# Patient Record
Sex: Male | Born: 1955 | Race: Black or African American | Hispanic: No | Marital: Married | Smoking: Current every day smoker
Health system: Southern US, Community
[De-identification: ages and names within clinical notes are randomized; demographics above are authoritative.]

## PROBLEM LIST (undated history)

## (undated) DIAGNOSIS — J45909 Unspecified asthma, uncomplicated: Secondary | ICD-10-CM

## (undated) DIAGNOSIS — E785 Hyperlipidemia, unspecified: Secondary | ICD-10-CM

## (undated) DIAGNOSIS — I2510 Atherosclerotic heart disease of native coronary artery without angina pectoris: Secondary | ICD-10-CM

## (undated) DIAGNOSIS — I10 Essential (primary) hypertension: Secondary | ICD-10-CM

## (undated) DIAGNOSIS — I509 Heart failure, unspecified: Secondary | ICD-10-CM

## (undated) DIAGNOSIS — Z8701 Personal history of pneumonia (recurrent): Secondary | ICD-10-CM

## (undated) DIAGNOSIS — I219 Acute myocardial infarction, unspecified: Secondary | ICD-10-CM

## (undated) HISTORY — DX: Atherosclerotic heart disease of native coronary artery without angina pectoris: I25.10

## (undated) HISTORY — DX: Essential (primary) hypertension: I10

## (undated) HISTORY — DX: Unspecified asthma, uncomplicated: J45.909

## (undated) HISTORY — DX: Personal history of pneumonia (recurrent): Z87.01

## (undated) HISTORY — PX: APPENDECTOMY: SHX54

## (undated) HISTORY — DX: Hyperlipidemia, unspecified: E78.5

---

## 2005-07-04 ENCOUNTER — Emergency Department (HOSPITAL_COMMUNITY): Admission: EM | Admit: 2005-07-04 | Discharge: 2005-07-04 | Payer: Self-pay | Admitting: Emergency Medicine

## 2013-11-27 ENCOUNTER — Encounter: Payer: Self-pay | Admitting: Cardiology

## 2013-11-27 DIAGNOSIS — E782 Mixed hyperlipidemia: Secondary | ICD-10-CM | POA: Insufficient documentation

## 2013-11-27 DIAGNOSIS — I2510 Atherosclerotic heart disease of native coronary artery without angina pectoris: Secondary | ICD-10-CM | POA: Insufficient documentation

## 2013-11-27 DIAGNOSIS — I10 Essential (primary) hypertension: Secondary | ICD-10-CM | POA: Insufficient documentation

## 2013-11-27 NOTE — Progress Notes (Signed)
Patient canceled.  This encounter was created in error - please disregard. 

## 2013-12-01 ENCOUNTER — Ambulatory Visit (INDEPENDENT_AMBULATORY_CARE_PROVIDER_SITE_OTHER): Payer: Medicaid Other | Admitting: Cardiovascular Disease

## 2013-12-01 ENCOUNTER — Encounter: Payer: Self-pay | Admitting: *Deleted

## 2013-12-01 ENCOUNTER — Encounter: Payer: Self-pay | Admitting: Cardiovascular Disease

## 2013-12-01 VITALS — BP 127/78 | HR 82 | Ht 69.0 in | Wt 190.0 lb

## 2013-12-01 DIAGNOSIS — Z9861 Coronary angioplasty status: Secondary | ICD-10-CM

## 2013-12-01 DIAGNOSIS — I209 Angina pectoris, unspecified: Secondary | ICD-10-CM

## 2013-12-01 DIAGNOSIS — R079 Chest pain, unspecified: Secondary | ICD-10-CM

## 2013-12-01 DIAGNOSIS — I509 Heart failure, unspecified: Secondary | ICD-10-CM

## 2013-12-01 DIAGNOSIS — Z7189 Other specified counseling: Secondary | ICD-10-CM

## 2013-12-01 DIAGNOSIS — F17200 Nicotine dependence, unspecified, uncomplicated: Secondary | ICD-10-CM

## 2013-12-01 DIAGNOSIS — I25119 Atherosclerotic heart disease of native coronary artery with unspecified angina pectoris: Secondary | ICD-10-CM

## 2013-12-01 DIAGNOSIS — Z955 Presence of coronary angioplasty implant and graft: Secondary | ICD-10-CM

## 2013-12-01 DIAGNOSIS — Z716 Tobacco abuse counseling: Secondary | ICD-10-CM

## 2013-12-01 DIAGNOSIS — I2510 Atherosclerotic heart disease of native coronary artery without angina pectoris: Secondary | ICD-10-CM

## 2013-12-01 MED ORDER — VARENICLINE TARTRATE 0.5 MG X 11 & 1 MG X 42 PO MISC: ORAL | Status: DC

## 2013-12-01 MED ORDER — NITROGLYCERIN 0.4 MG SL SUBL: 0.4000 mg | SUBLINGUAL_TABLET | SUBLINGUAL | Status: DC | PRN

## 2013-12-01 NOTE — Patient Instructions (Addendum)
   Chantix starter pack  Nitroglycerin as needed for severe chest pain only   Above medications sent to pharmacy Continue all other medications.   Your physician has requested that you have an echocardiogram. Echocardiography is a painless test that uses sound waves to create images of your heart. It provides your doctor with information about the size and shape of your heart and how well your heart's chambers and valves are working. This procedure takes approximately one hour. There are no restrictions for this procedure. Your physician has requested that you have a lexiscan myoview. For further information please visit [email or web address]. Please follow instruction sheet, as given. Office will contact with results via phone or letter.   Follow up in  1 month

## 2013-12-01 NOTE — Progress Notes (Signed)
Patient ID: Sofie RowerClarence E Ferg, male   DOB: Mar 06, 1955, 58 y.o.   MRN: [id number]       CARDIOLOGY CONSULT NOTE  Patient ID: Sofie RowerClarence E Krell MRN: [id number] DOB/AGE: Mar 06, 1955 58 y.o.  Admit date: (Not on file) Primary Physician DONDIEGO,RICHARD M, MD  Reason for Consultation: CAD, CHF  HPI: The patient  is a [age] year old male who was recently hospitalized [facility] for a COPD exacerbation with possible underlying congestive heart failure. He reportedly has a history of coronary artery disease and prior stent placement, hypertension, hyperlipidemia and tobacco abuse. He ruled out for an acute coronary syndrome with negative troponins. An ECG dated 11/23/2013 showed normal sinus rhythm with LVH and repolarization abnormalities. I reviewed all relevant studies from his hospitalization which included a BUN of 10, creatinine 1.09, sodium 139, potassium 3.5, white blood cells 9.7, hemoglobin 14.4, platelets 309.  He reportedly had a stent placed in 2003 at [facility] in [place], [place]. He said he had a heart attack at that time. He has been having episodic chest pain at rest since his hospitalization. He said this is the first time this has happened in 3-4 years. He does not have any sublingual nitroglycerin. He has 2 pillow orthopnea and has had 2 episodes of paroxysmal nocturnal dyspnea. He is currently not on a diuretic. He said he can walk indefinitely on level ground but has difficulty walking uphill and get short of breath. His cut back his smoking from one pack a day to 5 or 6 cigarettes daily and wants to quit. He denies palpitations, leg swelling, dizziness and syncope.  Soc: Smokes 5-6 cigarettes daily, had been 1 ppd for several years.  No Known Allergies  Current Outpatient Prescriptions  Medication Sig Dispense Refill  . albuterol (PROVENTIL) (2.5 MG/3ML) 0.083% nebulizer solution Take 2.5 mg by nebulization every 6 (six) hours as needed for wheezing or  shortness of breath.      Marland Kitchen. amLODipine-olmesartan (AZOR) 5-40 MG per tablet Take 1 tablet by mouth daily.      Marland Kitchen. levofloxacin (LEVAQUIN) 750 MG tablet Take 750 mg by mouth daily.      . pravastatin (PRAVACHOL) 40 MG tablet Take 40 mg by mouth daily.      . predniSONE (DELTASONE) 20 MG tablet Take 20 mg by mouth daily with breakfast.       No current facility-administered medications for this visit.    Past Medical History  Diagnosis Date  . Coronary atherosclerosis of native coronary artery   . Essential hypertension, benign   . Hyperlipidemia   . Childhood asthma   . History of pneumonia     September 2015 - [facility]     Past Surgical History  Procedure Laterality Date  . Appendectomy      History   Social History  . Marital Status: Married    Spouse Name: N/A    Number of Children: N/A  . Years of Education: N/A   Occupational History  . Not on file.   Social History Main Topics  . Smoking status: Current Every Day Smoker -- 0.50 packs/day    Types: Cigarettes    Start date: 03/03/1979  . Smokeless tobacco: Never Used  . Alcohol Use: Yes     Comment: Occasional  . Drug Use: No     Comment: Prior history of cocaine and marijuana (positive UDS 2004)  . Sexual Activity: Not on file   Other Topics Concern  . Not on file  Social History Narrative  . No narrative on file     No family history of premature CAD in 1st degree relatives.  Prior to Admission medications   Medication Sig Start Date End Date Taking? Authorizing Provider  albuterol (PROVENTIL) (2.5 MG/3ML) 0.083% nebulizer solution Take 2.5 mg by nebulization every 6 (six) hours as needed for wheezing or shortness of breath.   Yes Historical Provider, MD  amLODipine-olmesartan (AZOR) 5-40 MG per tablet Take 1 tablet by mouth daily.   Yes Historical Provider, MD  levofloxacin (LEVAQUIN) 750 MG tablet Take 750 mg by mouth daily.   Yes Historical Provider, MD  pravastatin (PRAVACHOL) 40 MG  tablet Take 40 mg by mouth daily.   Yes Historical Provider, MD  predniSONE (DELTASONE) 20 MG tablet Take 20 mg by mouth daily with breakfast.   Yes Historical Provider, MD     Review of systems complete and found to be negative unless listed above in HPI     Physical exam Blood pressure 127/78, pulse 82, height 5\' 9"  (1.753 m), weight 190 lb (86.183 kg), SpO2 94.00%. General: NAD Neck: No JVD, no thyromegaly or thyroid nodule.  Lungs: Diminished air entry b/l, intermittent faint inspiratory wheezes, no crackles. CV: Nondisplaced PMI. Regular rate and rhythm, normal S1/S2, no S3/S4, no murmur.  No peripheral edema.  No carotid bruit.    Abdomen: Soft, nontender, no hepatosplenomegaly, no distention.  Skin: Intact without lesions or rashes.  Neurologic: Alert and oriented x 3.  Psych: Normal affect. Extremities: No clubbing or cyanosis.  HEENT: Normal.   ECG: Most recent ECG reviewed.  Labs:   No results found for this basename: WBC, HGB, HCT, MCV, PLT   No results found for this basename: NA, K, CL, CO2, BUN, CREATININE, CALCIUM, LABALBU, PROT, BILITOT, ALKPHOS, ALT, AST, GLUCOSE,  in the last 168 hours No results found for this basename: CKTOTAL, CKMB, CKMBINDEX, TROPONINI    No results found for this basename: CHOL   No results found for this basename: HDL   No results found for this basename: LDLCALC   No results found for this basename: TRIG   No results found for this basename: CHOLHDL   No results found for this basename: LDLDIRECT         Studies: No results found.  ASSESSMENT AND PLAN:  1. CAD: Will prescribe SL nitro. He is taking ASA and statin, but no beta blocker. Will attempt to get records from [place]. Will obtain Lexiscan Cardiolite stress test and echocardiogram for further clarification. 2. Essential HTN: Controlled on current therapy which includes Azor 5/40 mg daily. 3. Hyperlipidemia: On pravastatin 40 mg daily. 4. Possible CHF (uncertain  type): Will obtain an echocardiogram for further clarification. Presently not on a diuretic.  5. Tobacco abuse: Cessation counseling given. Will prescribe Chantix starter pack.  Dispo: f/u 1 month.  Signed: Prentice Docker, M.D., F.A.C.C.  12/01/2013, 9:02 AM

## 2013-12-08 ENCOUNTER — Ambulatory Visit (HOSPITAL_COMMUNITY)
Admission: RE | Admit: 2013-12-08 | Discharge: 2013-12-08 | Disposition: A | Discharge status: Home / Self Care | Payer: Medicaid Other | Source: Ambulatory Visit | Attending: Cardiovascular Disease | Admitting: Cardiovascular Disease

## 2013-12-08 ENCOUNTER — Encounter (HOSPITAL_COMMUNITY)
Admission: RE | Admit: 2013-12-08 | Discharge: 2013-12-08 | Disposition: A | Discharge status: Home / Self Care | Payer: Medicaid Other | Source: Ambulatory Visit | Attending: Cardiovascular Disease | Admitting: Cardiovascular Disease

## 2013-12-08 ENCOUNTER — Encounter (HOSPITAL_COMMUNITY): Payer: Self-pay

## 2013-12-08 DIAGNOSIS — I10 Essential (primary) hypertension: Secondary | ICD-10-CM | POA: Insufficient documentation

## 2013-12-08 DIAGNOSIS — I25119 Atherosclerotic heart disease of native coronary artery with unspecified angina pectoris: Secondary | ICD-10-CM

## 2013-12-08 DIAGNOSIS — I2510 Atherosclerotic heart disease of native coronary artery without angina pectoris: Secondary | ICD-10-CM | POA: Insufficient documentation

## 2013-12-08 DIAGNOSIS — R079 Chest pain, unspecified: Secondary | ICD-10-CM | POA: Insufficient documentation

## 2013-12-08 DIAGNOSIS — I509 Heart failure, unspecified: Secondary | ICD-10-CM

## 2013-12-08 DIAGNOSIS — J449 Chronic obstructive pulmonary disease, unspecified: Secondary | ICD-10-CM | POA: Diagnosis not present

## 2013-12-08 DIAGNOSIS — I517 Cardiomegaly: Secondary | ICD-10-CM

## 2013-12-08 DIAGNOSIS — R0602 Shortness of breath: Secondary | ICD-10-CM | POA: Diagnosis present

## 2013-12-08 HISTORY — DX: Heart failure, unspecified: I50.9

## 2013-12-08 MED ORDER — TECHNETIUM TC 99M SESTAMIBI GENERIC - CARDIOLITE
30.0000 | Freq: Once | INTRAVENOUS | Status: AC | PRN
  Administered 2013-12-08: 30 via INTRAVENOUS

## 2013-12-08 MED ORDER — SODIUM CHLORIDE 0.9 % IJ SOLN
10.0000 mL | INTRAMUSCULAR | Status: DC | PRN
  Administered 2013-12-08: 10 mL via INTRAVENOUS

## 2013-12-08 MED ORDER — REGADENOSON 0.4 MG/5ML IV SOLN
INTRAVENOUS | Status: AC
  Administered 2013-12-08: 0.4 mg via INTRAVENOUS
  Filled 2013-12-08: qty 5

## 2013-12-08 MED ORDER — TECHNETIUM TC 99M SESTAMIBI - CARDIOLITE
10.0000 | Freq: Once | INTRAVENOUS | Status: AC | PRN
  Administered 2013-12-08: 09:00:00 10 via INTRAVENOUS

## 2013-12-08 MED ORDER — REGADENOSON 0.4 MG/5ML IV SOLN
0.4000 mg | Freq: Once | INTRAVENOUS | Status: AC | PRN
  Administered 2013-12-08: 0.4 mg via INTRAVENOUS

## 2013-12-08 MED ORDER — SODIUM CHLORIDE 0.9 % IJ SOLN
INTRAMUSCULAR | Status: AC
  Administered 2013-12-08: 10 mL via INTRAVENOUS
  Filled 2013-12-08: qty 10

## 2013-12-08 NOTE — Progress Notes (Signed)
Stress Lab Nurses Notes - Jeffery Munoz  Jeffery Munoz 12/08/2013 Reason for doing test: CAD CHF & SOB Type of test: Marlane HatcherLexiscan Cardiolite Nurse performing test: Parke PoissonPhyllis Billingsly, RN Nuclear Medicine Tech: Lyndel Pleasureyan Liles Echo Tech: Not Applicable MD performing test: Koneswaran/K.Lyman BishopLawrence NP Family MD: [facility] Test explained and consent signed: Yes.   IV started: No redness or edema and Saline lock started in radiology Symptoms: headache & stomach discomfort Treatment/Intervention: None Reason test stopped: protocol completed After recovery IV was: Discontinued via X-ray tech and No redness or edema Patient to return to Nuc. Med at : 11:35 Patient discharged: Home Patient's Condition upon discharge was: stable Comments: During test BP 137/81 & HR 93.  Recovery BP 147/87 & HR 75 .  Symptoms resolved in recovery.  Erskine SpeedBillingsley, Santana Gosdin T

## 2013-12-08 NOTE — Progress Notes (Signed)
  Echocardiogram 2D Echocardiogram has been performed.  Jeffery Munoz 12/08/2013, 12:07 PM

## 2013-12-14 ENCOUNTER — Telehealth: Payer: Self-pay | Admitting: *Deleted

## 2013-12-14 ENCOUNTER — Encounter: Payer: Self-pay | Admitting: *Deleted

## 2013-12-14 NOTE — Telephone Encounter (Signed)
Notes Recorded by Lesle ChrisAngela G Evalisse Prajapati, LPN on 16/10/960410/02/2014 at 2:52 PM Fast busy signal again. Patient notified of results by letter.  ------  Notes Recorded by Lesle ChrisAngela G Graziella Connery, LPN on 54/0/981110/10/2013 at 3:40 PM Fast busy signal again. ------  Notes Recorded by Lesle ChrisAngela G Kayson Bullis, LPN on 91/4/782910/10/2013 at 9:52 AM Busy.

## 2013-12-14 NOTE — Telephone Encounter (Signed)
STRESS TEST -   Notes Recorded by Laqueta LindenSuresh A Koneswaran, MD on 12/09/2013 at 9:43 AM Inferior wall infarct seen, no new blockages to suggest ischemia. Continue medical therapy for CAD.

## 2013-12-14 NOTE — Telephone Encounter (Addendum)
ECHO -   Message copied by Lesle ChrisHILL, ANGELA G on Mon Dec 14, 2013  2:54 PM ------      Message from: Prentice DockerKONESWARAN, SURESH A      Created: Tue Dec 08, 2013  3:54 PM       Normal pumping function. ------

## 2013-12-31 ENCOUNTER — Ambulatory Visit: Payer: Medicaid Other | Admitting: Cardiovascular Disease

## 2014-01-11 ENCOUNTER — Ambulatory Visit: Payer: Medicaid Other | Admitting: Cardiovascular Disease

## 2014-01-13 ENCOUNTER — Encounter: Payer: Self-pay | Admitting: Cardiovascular Disease

## 2014-01-13 ENCOUNTER — Ambulatory Visit (INDEPENDENT_AMBULATORY_CARE_PROVIDER_SITE_OTHER): Payer: Medicaid Other | Admitting: Cardiovascular Disease

## 2014-01-13 VITALS — BP 150/90 | HR 67 | Ht 69.0 in | Wt 196.0 lb

## 2014-01-13 DIAGNOSIS — E785 Hyperlipidemia, unspecified: Secondary | ICD-10-CM

## 2014-01-13 DIAGNOSIS — Z716 Tobacco abuse counseling: Secondary | ICD-10-CM

## 2014-01-13 DIAGNOSIS — I10 Essential (primary) hypertension: Secondary | ICD-10-CM

## 2014-01-13 DIAGNOSIS — I5032 Chronic diastolic (congestive) heart failure: Secondary | ICD-10-CM | POA: Diagnosis not present

## 2014-01-13 DIAGNOSIS — I25119 Atherosclerotic heart disease of native coronary artery with unspecified angina pectoris: Secondary | ICD-10-CM

## 2014-01-13 DIAGNOSIS — Z955 Presence of coronary angioplasty implant and graft: Secondary | ICD-10-CM

## 2014-01-13 MED ORDER — VARENICLINE TARTRATE 0.5 MG X 11 & 1 MG X 42 PO MISC: ORAL | Status: DC

## 2014-01-13 MED ORDER — AMLODIPINE-OLMESARTAN 10-40 MG PO TABS: 1.0000 | ORAL_TABLET | Freq: Every day | ORAL | Status: DC

## 2014-01-13 NOTE — Progress Notes (Signed)
Patient ID: Jeffery Munoz, male   DOB: 1956/02/28, 58 y.o.   MRN: [id number]      SUBJECTIVE: The patient presents for follow-up after undergoing cardiovascular testing performed for the evaluation of chest pain in the setting of coronary artery disease. Nuclear stress test on 12/09/2013 demonstrated inferior wall scar with inferior wall hypokinesis, with no ischemic territories noted. Echocardiogram demonstrated normal left ventricular systolic function, EF 65-99%, moderate LVH with grade 2 diastolic dysfunction.  He denies any further episodes of chest pain and has not had to use sublingual nitroglycerin. He did not receive the Chantix starter kit which I prescribed.  Review of Systems: As per "subjective", otherwise negative.  No Known Allergies  Current Outpatient Prescriptions  Medication Sig Dispense Refill  . amLODipine-olmesartan (AZOR) 5-40 MG per tablet Take 1 tablet by mouth daily.    Marland Kitchen levofloxacin (LEVAQUIN) 750 MG tablet Take 750 mg by mouth daily.    . nitroGLYCERIN (NITROSTAT) 0.4 MG SL tablet Place 1 tablet (0.4 mg total) under the tongue every 5 (five) minutes as needed for chest pain. 90 tablet 3  . pravastatin (PRAVACHOL) 40 MG tablet Take 40 mg by mouth daily.    . predniSONE (DELTASONE) 20 MG tablet Take 20 mg by mouth daily with breakfast.    . varenicline (CHANTIX STARTING MONTH PAK) 0.5 MG X 11 & 1 MG X 42 tablet Take one 0.5 mg tablet by mouth once daily for 3 days, then increase to one 0.5 mg tablet twice daily for 4 days, then increase to one 1 mg tablet twice daily. 53 tablet 0  . albuterol (PROVENTIL) (2.5 MG/3ML) 0.083% nebulizer solution Take 2.5 mg by nebulization every 6 (six) hours as needed for wheezing or shortness of breath.     No current facility-administered medications for this visit.    Past Medical History  Diagnosis Date  . Coronary atherosclerosis of native coronary artery   . Essential hypertension, benign   . Hyperlipidemia   .  Childhood asthma   . History of pneumonia     September 2015 - treated at [facility]   . CHF (congestive heart failure)     Past Surgical History  Procedure Laterality Date  . Appendectomy      History   Social History  . Marital Status: Married    Spouse Name: N/A    Number of Children: N/A  . Years of Education: N/A   Occupational History  . Not on file.   Social History Main Topics  . Smoking status: Current Every Day Smoker -- 0.50 packs/day    Types: Cigarettes    Start date: 03/03/1979  . Smokeless tobacco: Never Used  . Alcohol Use: Yes     Comment: Occasional  . Drug Use: No     Comment: Prior history of cocaine and marijuana (positive UDS 2004)  . Sexual Activity: Not on file   Other Topics Concern  . Not on file   Social History Narrative     Filed Vitals:   01/13/14 1514  BP: 150/90  Pulse: 67  Height: 5' 9"  (1.753 m)  Weight: 196 lb (88.905 kg)    PHYSICAL EXAM General: NAD Neck: No JVD, no thyromegaly or thyroid nodule.  Lungs: Diminished air entry b/l, intermittent faint inspiratory wheezes, no crackles. CV: Nondisplaced PMI. Regular rate and rhythm, normal S1/S2, no S3/S4, no murmur. No peripheral edema. No carotid bruit.  Abdomen: Soft, nontender, no hepatosplenomegaly, no distention.  Skin: Intact without lesions or rashes.  Neurologic: Alert and oriented x 3.  Psych: Normal affect. Skin: Normal. Musculoskeletal: Normal range of motion, no gross deformities. Extremities: No clubbing or cyanosis.   ECG: Most recent ECG reviewed.      ASSESSMENT AND PLAN: 1. CAD: Stable ischemic heart disease. Continue SL nitro prn, along with ASA and statin. 2. Essential HTN: Elevated on current therapy which includes Azor 5/40 mg daily. Given grade 2 diastolic dysfunction, will increase amlodipine component to 10 mg. 3. Hyperlipidemia: On pravastatin 40 mg daily. No changes. 4. Chronic diastolic heart failure: Euvolemic and stable with  echo results noted above. Controlling BP will be of prime importance.  5. Tobacco abuse: I previously prescribed a Chantix starter pack and gave him cessation counseling. Will give another prescription.  Dispo: f/u 6 months.   Kate Sable, M.D., F.A.C.C.

## 2014-01-13 NOTE — Patient Instructions (Addendum)
Your physician recommends that you schedule a follow-up appointment in: 6 months. You will receive a reminder letter in the mail in about 4 months reminding you to call and schedule your appointment. If you don't receive this letter, please contact our office. Your physician has recommended you make the following change in your medication:  Increase your azor to 10/40 mg daily.  Continue all other medications the same.

## 2014-01-25 ENCOUNTER — Other Ambulatory Visit: Payer: Self-pay | Admitting: Cardiovascular Disease

## 2014-01-25 NOTE — Telephone Encounter (Signed)
Prior auth request received for azor.

## 2014-01-25 NOTE — Telephone Encounter (Signed)
Please see refill bin for Prior Authorization / tgs  °

## 2014-03-02 ENCOUNTER — Telehealth: Payer: Self-pay | Admitting: *Deleted

## 2014-03-02 ENCOUNTER — Other Ambulatory Visit: Payer: Self-pay | Admitting: *Deleted

## 2014-03-02 MED ORDER — LOSARTAN POTASSIUM 50 MG PO TABS: 50.0000 mg | ORAL_TABLET | Freq: Every day | ORAL | Status: DC

## 2014-03-02 MED ORDER — AMLODIPINE BESYLATE 10 MG PO TABS: 10.0000 mg | ORAL_TABLET | Freq: Every day | ORAL | Status: DC

## 2014-03-02 NOTE — Telephone Encounter (Signed)
Can switch to amlodipine 10 mg and losartan 50 mg daily.

## 2014-03-02 NOTE — Telephone Encounter (Signed)
Prior Authorization for Azor denied by Medicaid. Patient is required to try and fail two preferred agents from the same class unless there is clinical justification for the use of Azor.

## 2014-03-03 ENCOUNTER — Other Ambulatory Visit: Payer: Self-pay | Admitting: Cardiovascular Disease

## 2014-03-03 NOTE — Telephone Encounter (Signed)
Prior Authorization# [id number] for losartan 50 mg.

## 2014-03-03 NOTE — Telephone Encounter (Signed)
Prior authorization for Losartan / please see refill bin / tgs

## 2014-04-22 ENCOUNTER — Emergency Department (HOSPITAL_COMMUNITY)
Admission: EM | Admit: 2014-04-22 | Discharge: 2014-04-23 | Disposition: A | Discharge status: Home / Self Care | Payer: Medicaid Other | Attending: Emergency Medicine | Admitting: Emergency Medicine

## 2014-04-22 ENCOUNTER — Encounter (HOSPITAL_COMMUNITY): Payer: Self-pay | Admitting: *Deleted

## 2014-04-22 DIAGNOSIS — E785 Hyperlipidemia, unspecified: Secondary | ICD-10-CM | POA: Insufficient documentation

## 2014-04-22 DIAGNOSIS — I509 Heart failure, unspecified: Secondary | ICD-10-CM | POA: Diagnosis not present

## 2014-04-22 DIAGNOSIS — Z720 Tobacco use: Secondary | ICD-10-CM | POA: Insufficient documentation

## 2014-04-22 DIAGNOSIS — J45909 Unspecified asthma, uncomplicated: Secondary | ICD-10-CM | POA: Insufficient documentation

## 2014-04-22 DIAGNOSIS — R443 Hallucinations, unspecified: Secondary | ICD-10-CM

## 2014-04-22 DIAGNOSIS — I10 Essential (primary) hypertension: Secondary | ICD-10-CM | POA: Diagnosis not present

## 2014-04-22 DIAGNOSIS — Z8701 Personal history of pneumonia (recurrent): Secondary | ICD-10-CM | POA: Insufficient documentation

## 2014-04-22 DIAGNOSIS — Z79899 Other long term (current) drug therapy: Secondary | ICD-10-CM | POA: Diagnosis not present

## 2014-04-22 DIAGNOSIS — N390 Urinary tract infection, site not specified: Secondary | ICD-10-CM | POA: Diagnosis not present

## 2014-04-22 DIAGNOSIS — Z7952 Long term (current) use of systemic steroids: Secondary | ICD-10-CM | POA: Diagnosis not present

## 2014-04-22 DIAGNOSIS — I2510 Atherosclerotic heart disease of native coronary artery without angina pectoris: Secondary | ICD-10-CM | POA: Diagnosis not present

## 2014-04-22 NOTE — ED Notes (Signed)
Pt is a prisoner , says his bp med was changed  And it has been up and down over a week, No pain or sx at present.

## 2014-04-23 ENCOUNTER — Emergency Department (HOSPITAL_COMMUNITY): Payer: Medicaid Other

## 2014-04-23 LAB — RAPID URINE DRUG SCREEN, HOSP PERFORMED
Amphetamines: NOT DETECTED
BENZODIAZEPINES: NOT DETECTED
Barbiturates: NOT DETECTED
COCAINE: NOT DETECTED
OPIATES: NOT DETECTED
TETRAHYDROCANNABINOL: NOT DETECTED

## 2014-04-23 LAB — COMPREHENSIVE METABOLIC PANEL
ALBUMIN: 4.8 g/dL (ref 3.5–5.2)
ALK PHOS: 68 U/L (ref 39–117)
ALT: 11 U/L (ref 0–53)
ANION GAP: 5 (ref 5–15)
AST: 20 U/L (ref 0–37)
BUN: 14 mg/dL (ref 6–23)
CHLORIDE: 107 mmol/L (ref 96–112)
CO2: 27 mmol/L (ref 19–32)
Calcium: 9.7 mg/dL (ref 8.4–10.5)
Creatinine, Ser: 1.37 mg/dL — ABNORMAL HIGH (ref 0.50–1.35)
GFR calc Af Amer: 64 mL/min — ABNORMAL LOW (ref >90)
GFR calc non Af Amer: 55 mL/min — ABNORMAL LOW (ref >90)
GLUCOSE: 103 mg/dL — AB (ref 70–99)
POTASSIUM: 3.9 mmol/L (ref 3.5–5.1)
Sodium: 139 mmol/L (ref 135–145)
Total Bilirubin: 0.9 mg/dL (ref 0.3–1.2)
Total Protein: 7.8 g/dL (ref 6.0–8.3)

## 2014-04-23 LAB — URINALYSIS, ROUTINE W REFLEX MICROSCOPIC
Glucose, UA: NEGATIVE mg/dL
Ketones, ur: NEGATIVE mg/dL
Nitrite: NEGATIVE
Specific Gravity, Urine: >1.03 — ABNORMAL HIGH (ref 1.005–1.030)
UROBILINOGEN UA: 0.2 mg/dL (ref 0.0–1.0)
pH: 5.5 (ref 5.0–8.0)

## 2014-04-23 LAB — DIFFERENTIAL
BASOS PCT: 1 % (ref 0–1)
Basophils Absolute: 0 10*3/uL (ref 0.0–0.1)
EOS PCT: 2 % (ref 0–5)
Eosinophils Absolute: 0.1 10*3/uL (ref 0.0–0.7)
Lymphocytes Relative: 39 % (ref 12–46)
Lymphs Abs: 2.5 10*3/uL (ref 0.7–4.0)
MONO ABS: 1 10*3/uL (ref 0.1–1.0)
MONOS PCT: 15 % — AB (ref 3–12)
Neutro Abs: 2.8 10*3/uL (ref 1.7–7.7)
Neutrophils Relative %: 43 % (ref 43–77)

## 2014-04-23 LAB — CBC
HEMATOCRIT: 44.6 % (ref 39.0–52.0)
HEMOGLOBIN: 14.8 g/dL (ref 13.0–17.0)
MCH: 30.8 pg (ref 26.0–34.0)
MCHC: 33.2 g/dL (ref 30.0–36.0)
MCV: 92.9 fL (ref 78.0–100.0)
Platelets: 231 10*3/uL (ref 150–400)
RBC: 4.8 MIL/uL (ref 4.22–5.81)
RDW: 12.7 % (ref 11.5–15.5)
WBC: 6.5 10*3/uL (ref 4.0–10.5)

## 2014-04-23 LAB — LIPASE, BLOOD: Lipase: 30 U/L (ref 11–59)

## 2014-04-23 LAB — URINE MICROSCOPIC-ADD ON

## 2014-04-23 LAB — PROTIME-INR
INR: 1.25 (ref 0.00–1.49)
Prothrombin Time: 15.8 seconds — ABNORMAL HIGH (ref 11.6–15.2)

## 2014-04-23 LAB — APTT: APTT: 34 s (ref 24–37)

## 2014-04-23 LAB — TROPONIN I
Troponin I: 0.15 ng/mL — ABNORMAL HIGH (ref <0.031)
Troponin I: 0.14 ng/mL — ABNORMAL HIGH (ref <0.031)

## 2014-04-23 MED ORDER — DEXTROSE 5 % IV SOLN
2.0000 g | Freq: Once | INTRAVENOUS | Status: AC
  Administered 2014-04-23: 2 g via INTRAVENOUS
  Filled 2014-04-23: qty 2

## 2014-04-23 MED ORDER — CLONIDINE HCL 0.2 MG PO TABS
0.2000 mg | ORAL_TABLET | Freq: Once | ORAL | Status: AC
Start: 2014-04-23 — End: 2014-04-23
  Administered 2014-04-23: 0.2 mg via ORAL
  Filled 2014-04-23: qty 1

## 2014-04-23 MED ORDER — LEVOFLOXACIN 750 MG PO TABS: 750.0000 mg | ORAL_TABLET | Freq: Every day | ORAL | Status: DC

## 2014-04-23 MED ORDER — SODIUM CHLORIDE 0.9 % IV BOLUS (SEPSIS)
1000.0000 mL | Freq: Once | INTRAVENOUS | Status: AC
  Administered 2014-04-23: 1000 mL via INTRAVENOUS

## 2014-04-23 NOTE — Discharge Instructions (Signed)
Drink plenty of fluids. Take the antibiotic until gone. Your jail nurse can check on the urine culture results in 2-3 days.  Return to the ED if you feel worse, such as fever, chills, vomiting, weakness.  Your clonidine cannot be stopped "cold [place]". It needs to be tapered off slowly or you will get rebound hypertension. I would suggest putting you back on the 0.2 mg twice a day for 1-2 days then drop it down to 0.1 mg twice a day for a week, then 0.1 mg once a day for a week, then stop.

## 2014-04-23 NOTE — ED Notes (Signed)
Patient has EKG done and placed on cardiac monitoring. Patient has Technical sales engineerfficer with him

## 2014-04-23 NOTE — ED Notes (Signed)
When returning from [place], pt B/P registered 179/103, rechecked B/P-160/96 P-56, informed Dr. Lynelle DoctorKnapp.

## 2014-04-23 NOTE — ED Provider Notes (Signed)
CSN: [id number]     Arrival date & time 04/22/14  2128 History   First MD Initiated Contact with Patient 04/22/14 2304     Chief Complaint  Patient presents with  . Hypertension     (Consider location/radiation/quality/duration/timing/severity/associated sxs/prior Treatment) HPI  Patient reports he's had hypertension for the past 40 years. He states his blood pressure medications have recently been changed. Looking at his documentation from his jail he had been on Azor 10/40 and he had also been on clonidine 0.2 mg twice a day that was stopped on February 16. On February 16 they added Norvasc 10 mg and losartan 50 mg daily. Per the notes from his jail they report he had some unusual behavior today. He was hiding under his bunk. When patient was asked about that he states he was playing a trichomoniasis on his next cell mate. They also described he had the Vicodin sheets wrapped around his arms. When patient was asked about that he said he did that because of the dogs that were in the jail today. He states there were 5 dogs in the jail with their handlers. He states one was a [place] and tube were pimples and there were a couple other dogs. He states to the handlers were in prison uniform and the others were in civilian clothes. He states they were letting the dogs jump and bark at people. He states they told him he was going to have to fight the dogs. The officer with patient states he would be very unusual for them to bring in more than one dog at a time. He states they do bring in a dog periodically to check or drugs. He states he is going to call the jail to see if they did have dogs in the jail today. Patient also states he has started having some right-sided abdominal pain that he gets intermittently. He has not had nausea, vomiting, or diarrhea. He states he had pneumonia about 2 months ago and still has a mild lingering cough. Other than the unusual behavior described at the jail patient is  fully alert and oriented to year date place. His officer reports patient has no difficulty walking.  PCP Dr Janna Arch  Past Medical History  Diagnosis Date  . Coronary atherosclerosis of native coronary artery   . Essential hypertension, benign   . Hyperlipidemia   . Childhood asthma   . History of pneumonia     September 2015 - treated at [facility]   . CHF (congestive heart failure)    Past Surgical History  Procedure Laterality Date  . Appendectomy     Family History  Problem Relation Age of Onset  . Heart disease Mother     Age 24-70   History  Substance Use Topics  . Smoking status: Current Every Day Smoker -- 0.50 packs/day    Types: Cigarettes    Start date: 03/03/1979  . Smokeless tobacco: Never Used  . Alcohol Use: Yes     Comment: Occasional    Review of Systems  All other systems reviewed and are negative.     Allergies  Review of patient's allergies indicates no known allergies.  Home Medications   Prior to Admission medications   Medication Sig Start Date End Date Taking? Authorizing Provider  albuterol (PROVENTIL) (2.5 MG/3ML) 0.083% nebulizer solution Take 2.5 mg by nebulization every 6 (six) hours as needed for wheezing or shortness of breath.    Historical Provider, MD  amLODipine (NORVASC) 10 MG tablet  Take 1 tablet (10 mg total) by mouth daily. 03/02/14   Laqueta Linden, MD  amLODipine-olmesartan (AZOR) 10-40 MG per tablet Take 1 tablet by mouth daily. 01/13/14   Laqueta Linden, MD  levofloxacin (LEVAQUIN) 750 MG tablet Take 750 mg by mouth daily.  September 2015    Historical Provider, MD  losartan (COZAAR) 50 MG tablet Take 1 tablet (50 mg total) by mouth daily. 03/02/14   Laqueta Linden, MD  nitroGLYCERIN (NITROSTAT) 0.4 MG SL tablet Place 1 tablet (0.4 mg total) under the tongue every 5 (five) minutes as needed for chest pain. 12/01/13   Laqueta Linden, MD  pravastatin (PRAVACHOL) 40 MG tablet Take 40 mg by mouth daily.     Historical Provider, MD  predniSONE (DELTASONE) 20 MG tablet Take 20 mg by mouth daily with breakfast.    Historical Provider, MD  varenicline (CHANTIX STARTING MONTH PAK) 0.5 MG X 11 & 1 MG X 42 tablet Take one 0.5 mg tablet by mouth once daily for 3 days, then increase to one 0.5 mg tablet twice daily for 4 days, then increase to one 1 mg tablet twice daily. 01/13/14   Laqueta Linden, MD    ED Triage Vitals  Enc Vitals Group     BP 04/22/14 2136 181/96 mmHg     Pulse Rate 04/22/14 2136 62     Resp 04/22/14 2136 20     Temp 04/22/14 2136 98 F (36.7 C)     Temp Source 04/22/14 2136 Oral     SpO2 04/22/14 2136 100 %     Weight 04/22/14 2136 189 lb (85.73 kg)     Height 04/22/14 2136  (1.778 m)     Head Cir --      Peak Flow --      Pain Score 04/22/14 2321 6     Pain Loc --      Pain Edu? --      Excl. in GC? --    Vital signs normal except hypertension    Physical Exam  Constitutional: He is oriented to person, place, and time. He appears well-developed and well-nourished.  Non-toxic appearance. He does not appear ill. No distress.  HENT:  Head: Normocephalic and atraumatic.  Right Ear: External ear normal.  Left Ear: External ear normal.  Nose: Nose normal. No mucosal edema or rhinorrhea.  Mouth/Throat: Oropharynx is clear and moist and mucous membranes are normal. No dental abscesses or uvula swelling.  Eyes: Conjunctivae and EOM are normal. Pupils are equal, round, and reactive to light.  Neck: Normal range of motion and full passive range of motion without pain. Neck supple.  Cardiovascular: Normal rate, regular rhythm and normal heart sounds.  Exam reveals no gallop and no friction rub.   No murmur heard. Pulmonary/Chest: Effort normal and breath sounds normal. No respiratory distress. He has no wheezes. He has no rhonchi. He has no rales. He exhibits no tenderness and no crepitus.  Abdominal: Soft. Normal appearance and bowel sounds are normal. He exhibits no  distension. There is no tenderness. There is no rebound and no guarding.    Area of patient's abdominal pain is noted.  Musculoskeletal: Normal range of motion. He exhibits no edema or tenderness.  Moves all extremities well.   Neurological: He is alert and oriented to person, place, and time. He has normal strength. No cranial nerve deficit.  Cranial nerves III through XII intact, there is no facial asymmetry, there is no pronator  drift, he has some mild weakness on his left grip. His lower extremities are not weak.  Skin: Skin is warm, dry and intact. No rash noted. No erythema. No pallor.  Psychiatric: He has a normal mood and affect. His speech is normal and behavior is normal. His mood appears not anxious.  Nursing note and vitals reviewed.   ED Course  Procedures (including critical care time)  Medications  cloNIDine (CATAPRES) tablet 0.2 mg (not administered)  cefTRIAXone (ROCEPHIN) 2 g in dextrose 5 % 50 mL IVPB (0 g Intravenous Stopped 04/23/14 0343)  sodium chloride 0.9 % bolus 1,000 mL (1,000 mLs Intravenous New Bag/Given 04/23/14 0350)    Patient's blood pressure improved while in the ED to 150/96 at 12:30 AM without treatment.  Review of patient's prior records shows he was evaluated by Dr.Koneswaren, cardiology, in September. He verbally describes the patient's EKG is showing LVH with repolarization changes. At that time he describes patient had been admitted at [facility] for COPD exacerbation and possible congestive heart failure. At time patient was on Azor for his hypertension. Patient had a stress test done in October which showed inferior wall infarct but no new ischemic changes. He had echocardiogram showing ejection fraction of 60-65% with moderate left ventricular hypertrophy and a grade 2 diastolic dysfunction. At that point Norvasc was added to the Azor for his blood pressure.  02:00 officer has verified there were no dogs in the jail today. Pt given his test  results and need to get repeat troponin done at 3 am. Pt hasn't given an urinalysis sample yet, he was given oral fluids to drink.  After reviewing patient's urinalysis result he was given Rocephin 2 g IV. This would explain some of his unusual behavior.  04:00 Discussed his troponin results. Pt verifies he hasn't been on the levaquin since September (was documented in Dr Sharene Skeans note in September after pt admitted for COPD exacerbation).   Labs Review Results for orders placed or performed during the hospital encounter of 04/22/14  Protime-INR  Result Value Ref Range   Prothrombin Time 15.8 (H) 11.6 - 15.2 seconds   INR 1.25 0.00 - 1.49  APTT  Result Value Ref Range   aPTT 34 24 - 37 seconds  CBC  Result Value Ref Range   WBC 6.5 4.0 - 10.5 K/uL   RBC 4.80 4.22 - 5.81 MIL/uL   Hemoglobin 14.8 13.0 - 17.0 g/dL   HCT 16.1 09.6 - 04.5 %   MCV 92.9 78.0 - 100.0 fL   MCH 30.8 26.0 - 34.0 pg   MCHC 33.2 30.0 - 36.0 g/dL   RDW 40.9 81.1 - 91.4 %   Platelets 231 150 - 400 K/uL  Differential  Result Value Ref Range   Neutrophils Relative % 43 43 - 77 %   Neutro Abs 2.8 1.7 - 7.7 K/uL   Lymphocytes Relative 39 12 - 46 %   Lymphs Abs 2.5 0.7 - 4.0 K/uL   Monocytes Relative 15 (H) 3 - 12 %   Monocytes Absolute 1.0 0.1 - 1.0 K/uL   Eosinophils Relative 2 0 - 5 %   Eosinophils Absolute 0.1 0.0 - 0.7 K/uL   Basophils Relative 1 0 - 1 %   Basophils Absolute 0.0 0.0 - 0.1 K/uL  Comprehensive metabolic panel  Result Value Ref Range   Sodium 139 135 - 145 mmol/L   Potassium 3.9 3.5 - 5.1 mmol/L   Chloride 107 96 - 112 mmol/L   CO2 27  19 - 32 mmol/L   Glucose, Bld 103 (H) 70 - 99 mg/dL   BUN 14 6 - 23 mg/dL   Creatinine, Ser 1.611.37 (H) 0.50 - 1.35 mg/dL   Calcium 9.7 8.4 - 09.610.5 mg/dL   Total Protein 7.8 6.0 - 8.3 g/dL   Albumin 4.8 3.5 - 5.2 g/dL   AST 20 0 - 37 U/L   ALT 11 0 - 53 U/L   Alkaline Phosphatase 68 39 - 117 U/L   Total Bilirubin 0.9 0.3 - 1.2 mg/dL   GFR calc non Af  Amer 55 (L) >90 mL/min   GFR calc Af Amer 64 (L) >90 mL/min   Anion gap 5 5 - 15  Urine Drug Screen  Result Value Ref Range   Opiates NONE DETECTED NONE DETECTED   Cocaine NONE DETECTED NONE DETECTED   Benzodiazepines NONE DETECTED NONE DETECTED   Amphetamines NONE DETECTED NONE DETECTED   Tetrahydrocannabinol NONE DETECTED NONE DETECTED   Barbiturates NONE DETECTED NONE DETECTED  Urinalysis, Routine w reflex microscopic  Result Value Ref Range   Color, Urine AMBER (A) YELLOW   APPearance HAZY (A) CLEAR   Specific Gravity, Urine >1.030 (H) 1.005 - 1.030   pH 5.5 5.0 - 8.0   Glucose, UA NEGATIVE NEGATIVE mg/dL   Hgb urine dipstick SMALL (A) NEGATIVE   Bilirubin Urine SMALL (A) NEGATIVE   Ketones, ur NEGATIVE NEGATIVE mg/dL   Protein, ur TRACE (A) NEGATIVE mg/dL   Urobilinogen, UA 0.2 0.0 - 1.0 mg/dL   Nitrite NEGATIVE NEGATIVE   Leukocytes, UA SMALL (A) NEGATIVE  Troponin I  Result Value Ref Range   Troponin I 0.15 (H) <0.031 ng/mL  Lipase, blood  Result Value Ref Range   Lipase 30 11 - 59 U/L  Troponin I  Result Value Ref Range   Troponin I 0.14 (H) <0.031 ng/mL  Urine microscopic-add on  Result Value Ref Range   Squamous Epithelial / LPF MANY (A) RARE   WBC, UA TOO NUMEROUS TO COUNT <3 WBC/hpf   RBC / HPF 0-2 <3 RBC/hpf   Bacteria, UA MANY (A) RARE   Laboratory interpretation all normal except positive troponin, renal insufficiency     Imaging Review Ct Head Wo Contrast  04/23/2014   CLINICAL DATA:  Headache, hypertension, confusion  EXAM: CT HEAD WITHOUT CONTRAST  TECHNIQUE: Contiguous axial images were obtained from the base of the skull through the vertex without intravenous contrast.  COMPARISON:  None.  FINDINGS: There is no intracranial hemorrhage, mass or evidence of acute infarction. There is moderate generalized atrophy. Basal cisterns are patent.  There is moderate membrane thickening in the maxillary sinuses and ethmoid air cells. Mild sphenoid sinus  disease also noted. Mastoids are clear.  IMPRESSION: Moderate generalized atrophy. No acute findings are evident. Mild paranasal sinus disease.   Electronically Signed   By: Ellery Plunkaniel R Mitchell M.D.   On: 04/23/2014 01:24     EKG Interpretation   Date/Time:  Friday April 23 2014 00:15:37 EST Ventricular Rate:  56 PR Interval:  168 QRS Duration: 75 QT Interval:  452 QTC Calculation: 436 R Axis:   46 Text Interpretation:  Sinus rhythm Left atrial enlargement Probable LVH  with secondary repol abnrm Tall R wave in V2, consider RVH or PMI Anterior  ST elevation, probably due to LVH Baseline wander in lead(s) III No old  tracing to compare Confirmed by Darvis Croft  MD-I, Rhian Funari ([id number]) on 04/23/2014  12:47:36 AM      MDM  Final diagnoses:  Essential hypertension  Urinary tract infection without hematuria, site unspecified  Hallucination    New Prescriptions   LEVOFLOXACIN (LEVAQUIN) 750 MG TABLET    Take 1 tablet (750 mg total) by mouth daily.    Plan discharge  Devoria Albe, MD, Franz Dell, MD 04/23/14 [id number]

## 2014-04-24 LAB — URINE CULTURE
Colony Count: NO GROWTH
Culture: NO GROWTH

## 2017-07-28 ENCOUNTER — Encounter (HOSPITAL_COMMUNITY): Payer: Self-pay | Admitting: Emergency Medicine

## 2017-07-28 ENCOUNTER — Emergency Department (HOSPITAL_COMMUNITY): Payer: Medicaid Other

## 2017-07-28 ENCOUNTER — Other Ambulatory Visit: Payer: Self-pay

## 2017-07-28 ENCOUNTER — Emergency Department (HOSPITAL_COMMUNITY)
Admission: EM | Admit: 2017-07-28 | Discharge: 2017-07-28 | Disposition: A | Discharge status: Home / Self Care | Payer: Medicaid Other | Attending: Emergency Medicine | Admitting: Emergency Medicine

## 2017-07-28 DIAGNOSIS — I509 Heart failure, unspecified: Secondary | ICD-10-CM | POA: Diagnosis not present

## 2017-07-28 DIAGNOSIS — J45909 Unspecified asthma, uncomplicated: Secondary | ICD-10-CM | POA: Insufficient documentation

## 2017-07-28 DIAGNOSIS — R0789 Other chest pain: Secondary | ICD-10-CM | POA: Diagnosis present

## 2017-07-28 DIAGNOSIS — F17210 Nicotine dependence, cigarettes, uncomplicated: Secondary | ICD-10-CM | POA: Insufficient documentation

## 2017-07-28 DIAGNOSIS — I110 Hypertensive heart disease with heart failure: Secondary | ICD-10-CM | POA: Diagnosis not present

## 2017-07-28 DIAGNOSIS — I2510 Atherosclerotic heart disease of native coronary artery without angina pectoris: Secondary | ICD-10-CM | POA: Diagnosis not present

## 2017-07-28 HISTORY — DX: Acute myocardial infarction, unspecified: I21.9

## 2017-07-28 LAB — DIFFERENTIAL
Basophils Absolute: 0 10*3/uL (ref 0.0–0.1)
Basophils Relative: 0 %
Eosinophils Absolute: 0.2 10*3/uL (ref 0.0–0.7)
Eosinophils Relative: 3 %
LYMPHS PCT: 41 %
Lymphs Abs: 2.4 10*3/uL (ref 0.7–4.0)
MONOS PCT: 11 %
Monocytes Absolute: 0.6 10*3/uL (ref 0.1–1.0)
Neutro Abs: 2.6 10*3/uL (ref 1.7–7.7)
Neutrophils Relative %: 45 %

## 2017-07-28 LAB — BASIC METABOLIC PANEL
Anion gap: 8 (ref 5–15)
BUN: 13 mg/dL (ref 6–20)
CHLORIDE: 110 mmol/L (ref 101–111)
CO2: 24 mmol/L (ref 22–32)
Calcium: 9.1 mg/dL (ref 8.9–10.3)
Creatinine, Ser: 1.2 mg/dL (ref 0.61–1.24)
GFR calc non Af Amer: >60 mL/min (ref >60)
GLUCOSE: 93 mg/dL (ref 65–99)
Potassium: 3.6 mmol/L (ref 3.5–5.1)
Sodium: 142 mmol/L (ref 135–145)

## 2017-07-28 LAB — TROPONIN I
Troponin I: <0.03 ng/mL (ref <0.03)
Troponin I: <0.03 ng/mL (ref <0.03)

## 2017-07-28 LAB — HEPATIC FUNCTION PANEL
ALK PHOS: 72 U/L (ref 38–126)
ALT: 7 U/L — AB (ref 17–63)
AST: 13 U/L — AB (ref 15–41)
Albumin: 3.8 g/dL (ref 3.5–5.0)
Bilirubin, Direct: 0.1 mg/dL (ref 0.1–0.5)
Indirect Bilirubin: 0.6 mg/dL (ref 0.3–0.9)
TOTAL PROTEIN: 6.9 g/dL (ref 6.5–8.1)
Total Bilirubin: 0.7 mg/dL (ref 0.3–1.2)

## 2017-07-28 LAB — I-STAT TROPONIN, ED: Troponin i, poc: 0.02 ng/mL (ref 0.00–0.08)

## 2017-07-28 LAB — CBC
HCT: 43.2 % (ref 39.0–52.0)
Hemoglobin: 14.1 g/dL (ref 13.0–17.0)
MCH: 30.5 pg (ref 26.0–34.0)
MCHC: 32.6 g/dL (ref 30.0–36.0)
MCV: 93.5 fL (ref 78.0–100.0)
Platelets: 218 10*3/uL (ref 150–400)
RBC: 4.62 MIL/uL (ref 4.22–5.81)
RDW: 13.8 % (ref 11.5–15.5)
WBC: 5.7 10*3/uL (ref 4.0–10.5)

## 2017-07-28 NOTE — ED Triage Notes (Signed)
Chest pain across top of chest since last night.  Pt also reports cough for a few days.

## 2017-07-28 NOTE — ED Provider Notes (Signed)
[facility] EMERGENCY DEPARTMENT Provider Note   CSN: [id number] Arrival date & time: 07/28/17  1447     History   Chief Complaint Chief Complaint  Patient presents with  . Chest Pain    HPI Jeffery Munoz is a 62 y.o. male.  Pt with chest pain  The history is provided by the patient. No language interpreter was used.  Chest Pain   This is a new problem. The current episode started 2 days ago. The problem occurs constantly. The problem has not changed since onset.The pain is associated with movement. The pain is present in the substernal region. The pain is at a severity of 5/10. Pertinent negatives include no abdominal pain, no back pain, no cough and no headaches.  Pertinent negatives for past medical history include no seizures.    Past Medical History:  Diagnosis Date  . CHF (congestive heart failure) (HCC)   . Childhood asthma   . Coronary atherosclerosis of native coronary artery   . Essential hypertension, benign   . History of pneumonia    September 2015 - treated at [facility]   . Hyperlipidemia   . MI (myocardial infarction) (HCC)    8 years ago    Patient Active Problem List   Diagnosis Date Noted  . History of coronary artery stent placement 12/01/2013  . CHF (congestive heart failure) (HCC) 12/01/2013  . Coronary atherosclerosis of native coronary artery 11/27/2013  . Essential hypertension, benign 11/27/2013  . Mixed hyperlipidemia 11/27/2013    Past Surgical History:  Procedure Laterality Date  . APPENDECTOMY          Home Medications    Prior to Admission medications   Not on File    Family History Family History  Problem Relation Age of Onset  . Heart disease Mother        Age 55-70    Social History Social History   Tobacco Use  . Smoking status: Current Every Day Smoker    Packs/day: 0.50    Types: Cigarettes    Start date: 03/03/1979  . Smokeless tobacco: Never Used  Substance Use Topics  . Alcohol use: Yes   Comment: Occasional  . Drug use: No    Comment: Prior history of cocaine and marijuana (positive UDS 2004)     Allergies   Patient has no known allergies.   Review of Systems Review of Systems  Constitutional: Negative for appetite change and fatigue.  HENT: Negative for congestion, ear discharge and sinus pressure.   Eyes: Negative for discharge.  Respiratory: Negative for cough.   Cardiovascular: Positive for chest pain.  Gastrointestinal: Negative for abdominal pain and diarrhea.  Genitourinary: Negative for frequency and hematuria.  Musculoskeletal: Negative for back pain.  Skin: Negative for rash.  Neurological: Negative for seizures and headaches.  Psychiatric/Behavioral: Negative for hallucinations.     Physical Exam Updated Vital Signs BP (!) 175/106   Pulse 61   Temp 98.6 F (37 C) (Oral)   Resp (!) 22   Ht  (1.676 m)   Wt 81.6 kg (180 lb)   SpO2 98%   BMI 29.05 kg/m   Physical Exam  Constitutional: He is oriented to person, place, and time. He appears well-developed.  HENT:  Head: Normocephalic.  Eyes: Conjunctivae and EOM are normal. No scleral icterus.  Neck: Neck supple. No thyromegaly present.  Cardiovascular: Normal rate and regular rhythm. Exam reveals no gallop and no friction rub.  No murmur heard. Pulmonary/Chest: No stridor. He has  no wheezes. He has no rales. He exhibits no tenderness.  Abdominal: He exhibits no distension. There is no tenderness. There is no rebound.  Musculoskeletal: Normal range of motion. He exhibits no edema.  Lymphadenopathy:    He has no cervical adenopathy.  Neurological: He is oriented to person, place, and time. He exhibits normal muscle tone. Coordination normal.  Skin: No rash noted. No erythema.  Psychiatric: He has a normal mood and affect. His behavior is normal.     ED Treatments / Results  Labs (all labs ordered are listed, but only abnormal results are displayed) Labs Reviewed  HEPATIC FUNCTION  PANEL - Abnormal; Notable for the following components:      Result Value   AST 13 (*)    ALT 7 (*)    All other components within normal limits  BASIC METABOLIC PANEL  CBC  TROPONIN I  DIFFERENTIAL  TROPONIN I  I-STAT TROPONIN, ED    EKG EKG Interpretation  Date/Time:  Sunday Jul 28 2017 14:57:10 EDT Ventricular Rate:  65 PR Interval:    QRS Duration: 79 QT Interval:  412 QTC Calculation: 429 R Axis:   63 Text Interpretation:  Sinus rhythm Probable left atrial enlargement LVH with secondary repolarization abnormality Anterior ST elevation, probably due to LVH Baseline wander in lead(s) V1 Confirmed by Bethann Berkshire [phone]) on 07/28/2017 3:07:31 PM   Radiology Dg Chest 2 View  Result Date: 07/28/2017 CLINICAL DATA:  Chest pain since last night. EXAM: CHEST - 2 VIEW COMPARISON:  05/12/2016. FINDINGS: The cardiac silhouette remains at the upper limit of normal in size. The aorta remains mildly tortuous. Mildly prominent pulmonary vasculature with some improvement. Clear lungs. Mild central peribronchial thickening. Minimal thoracic spine degenerative changes. IMPRESSION: 1. No acute abnormality. 2. Mild chronic bronchitic changes. 3. Mild pulmonary vascular congestion with improvement. Electronically Signed   By: Beckie Salts M.D.   On: 07/28/2017 16:36    Procedures Procedures (including critical care time)  Medications Ordered in ED Medications - No data to display   Initial Impression / Assessment and Plan / ED Course  I have reviewed the triage vital signs and the nursing notes.  Pertinent labs & imaging results that were available during my care of the patient were reviewed by me and considered in my medical decision making (see chart for details).   Chemistries CBC unremarkable.  Troponin x2 was negative.  Doubt pain is cardiac related patient will follow-up with his doctor this week for recheck    Final Clinical Impressions(s) / ED Diagnoses   Final diagnoses:    Atypical chest pain    ED Discharge Orders    None       Bethann Berkshire, MD 08/01/17 [phone]

## 2017-07-28 NOTE — ED Provider Notes (Signed)
[facility] EMERGENCY DEPARTMENT Provider Note   CSN: [id number] Arrival date & time: 07/28/17  1447     History   Chief Complaint Chief Complaint  Patient presents with  . Chest Pain    HPI Jeffery Munoz is a 62 y.o. male.  Patient states that he has been having mild chest discomfort off and on for a few days.  He has also had a cough that is nonproductive.  Patient states the pain is not related to exertion he is not short of breath and having no sweating  The history is provided by the patient.  Chest Pain   This is a new problem. The current episode started 2 days ago. The problem occurs rarely. The problem has been resolved. Associated with: Unknown. The pain is present in the lateral region. The pain is at a severity of 2/10. Pertinent negatives include no abdominal pain, no back pain, no cough and no headaches.  Pertinent negatives for past medical history include no seizures.    Past Medical History:  Diagnosis Date  . CHF (congestive heart failure) (HCC)   . Childhood asthma   . Coronary atherosclerosis of native coronary artery   . Essential hypertension, benign   . History of pneumonia    September 2015 - treated at [facility]   . Hyperlipidemia   . MI (myocardial infarction) (HCC)    8 years ago    Patient Active Problem List   Diagnosis Date Noted  . History of coronary artery stent placement 12/01/2013  . CHF (congestive heart failure) (HCC) 12/01/2013  . Coronary atherosclerosis of native coronary artery 11/27/2013  . Essential hypertension, benign 11/27/2013  . Mixed hyperlipidemia 11/27/2013    Past Surgical History:  Procedure Laterality Date  . APPENDECTOMY          Home Medications    Prior to Admission medications   Not on File    Family History Family History  Problem Relation Age of Onset  . Heart disease Mother        Age 72-70    Social History Social History   Tobacco Use  . Smoking status: Current Every Day Smoker      Packs/day: 0.50    Types: Cigarettes    Start date: 03/03/1979  . Smokeless tobacco: Never Used  Substance Use Topics  . Alcohol use: Yes    Comment: Occasional  . Drug use: No    Comment: Prior history of cocaine and marijuana (positive UDS 2004)     Allergies   Patient has no known allergies.   Review of Systems Review of Systems  Constitutional: Negative for appetite change and fatigue.  HENT: Negative for congestion, ear discharge and sinus pressure.   Eyes: Negative for discharge.  Respiratory: Negative for cough.   Cardiovascular: Positive for chest pain.  Gastrointestinal: Negative for abdominal pain and diarrhea.  Genitourinary: Negative for frequency and hematuria.  Musculoskeletal: Negative for back pain.  Skin: Negative for rash.  Neurological: Negative for seizures and headaches.  Psychiatric/Behavioral: Negative for hallucinations.     Physical Exam Updated Vital Signs BP (!) 175/106   Pulse 61   Temp 98.6 F (37 C) (Oral)   Resp (!) 22   Ht  (1.676 m)   Wt 81.6 kg (180 lb)   SpO2 98%   BMI 29.05 kg/m   Physical Exam  Constitutional: He is oriented to person, place, and time. He appears well-developed.  HENT:  Head: Normocephalic.  Eyes: Conjunctivae and  EOM are normal. No scleral icterus.  Neck: Neck supple. No thyromegaly present.  Cardiovascular: Normal rate and regular rhythm. Exam reveals no gallop and no friction rub.  No murmur heard. Pulmonary/Chest: No stridor. He has no wheezes. He has no rales. He exhibits no tenderness.  Abdominal: He exhibits no distension. There is no tenderness. There is no rebound.  Musculoskeletal: Normal range of motion. He exhibits no edema.  Lymphadenopathy:    He has no cervical adenopathy.  Neurological: He is oriented to person, place, and time. He exhibits normal muscle tone. Coordination normal.  Skin: No rash noted. No erythema.  Psychiatric: He has a normal mood and affect. His behavior is  normal.     ED Treatments / Results  Labs (all labs ordered are listed, but only abnormal results are displayed) Labs Reviewed  HEPATIC FUNCTION PANEL - Abnormal; Notable for the following components:      Result Value   AST 13 (*)    ALT 7 (*)    All other components within normal limits  BASIC METABOLIC PANEL  CBC  TROPONIN I  DIFFERENTIAL  TROPONIN I  I-STAT TROPONIN, ED    EKG EKG Interpretation  Date/Time:  Sunday Jul 28 2017 14:57:10 EDT Ventricular Rate:  65 PR Interval:    QRS Duration: 79 QT Interval:  412 QTC Calculation: 429 R Axis:   63 Text Interpretation:  Sinus rhythm Probable left atrial enlargement LVH with secondary repolarization abnormality Anterior ST elevation, probably due to LVH Baseline wander in lead(s) V1 Confirmed by Bethann Berkshire [id number]) on 07/28/2017 3:07:31 PM   Radiology Dg Chest 2 View  Result Date: 07/28/2017 CLINICAL DATA:  Chest pain since last night. EXAM: CHEST - 2 VIEW COMPARISON:  05/12/2016. FINDINGS: The cardiac silhouette remains at the upper limit of normal in size. The aorta remains mildly tortuous. Mildly prominent pulmonary vasculature with some improvement. Clear lungs. Mild central peribronchial thickening. Minimal thoracic spine degenerative changes. IMPRESSION: 1. No acute abnormality. 2. Mild chronic bronchitic changes. 3. Mild pulmonary vascular congestion with improvement. Electronically Signed   By: Beckie Salts M.D.   On: 07/28/2017 16:36    Procedures Procedures (including critical care time)  Medications Ordered in ED Medications - No data to display   Initial Impression / Assessment and Plan / ED Course  I have reviewed the triage vital signs and the nursing notes.  Pertinent labs & imaging results that were available during my care of the patient were reviewed by me and considered in my medical decision making (see chart for details). CBC and chemistries unremarkable.  Troponin x2 is negative.  Chest x-ray  shows bronchitis EKG shows no acute changes patient no longer having chest discomfort in the emergency department.  Doubt this is coronary artery related.  Patient will be sent home and he is to see his family doctor in 4 days anyway      Final Clinical Impressions(s) / ED Diagnoses   Final diagnoses:  Atypical chest pain    ED Discharge Orders    None       Bethann Berkshire, MD 07/28/17 [place]ina [place]

## 2017-07-28 NOTE — Discharge Instructions (Addendum)
Follow-up this week as planned with your doctor.  Return sooner if problems

## 2019-11-23 ENCOUNTER — Emergency Department (HOSPITAL_COMMUNITY): Payer: Medicaid Other

## 2019-11-23 ENCOUNTER — Inpatient Hospital Stay (HOSPITAL_COMMUNITY)
Admission: EM | Admit: 2019-11-23 | Discharge: 2019-11-26 | DRG: 177 | Disposition: A | Discharge status: Home / Self Care | Payer: Medicaid Other | Attending: Internal Medicine | Admitting: Internal Medicine

## 2019-11-23 ENCOUNTER — Ambulatory Visit
Admission: EM | Admit: 2019-11-23 | Discharge: 2019-11-23 | Disposition: A | Discharge status: Home / Self Care | Payer: Medicaid Other

## 2019-11-23 ENCOUNTER — Other Ambulatory Visit: Payer: Self-pay

## 2019-11-23 ENCOUNTER — Encounter (HOSPITAL_COMMUNITY): Payer: Self-pay | Admitting: *Deleted

## 2019-11-23 DIAGNOSIS — F17210 Nicotine dependence, cigarettes, uncomplicated: Secondary | ICD-10-CM | POA: Diagnosis present

## 2019-11-23 DIAGNOSIS — I252 Old myocardial infarction: Secondary | ICD-10-CM

## 2019-11-23 DIAGNOSIS — E785 Hyperlipidemia, unspecified: Secondary | ICD-10-CM | POA: Diagnosis present

## 2019-11-23 DIAGNOSIS — Z955 Presence of coronary angioplasty implant and graft: Secondary | ICD-10-CM | POA: Diagnosis not present

## 2019-11-23 DIAGNOSIS — Z720 Tobacco use: Secondary | ICD-10-CM

## 2019-11-23 DIAGNOSIS — N179 Acute kidney failure, unspecified: Secondary | ICD-10-CM | POA: Diagnosis present

## 2019-11-23 DIAGNOSIS — U071 COVID-19: Secondary | ICD-10-CM | POA: Diagnosis present

## 2019-11-23 DIAGNOSIS — J9601 Acute respiratory failure with hypoxia: Secondary | ICD-10-CM | POA: Diagnosis present

## 2019-11-23 DIAGNOSIS — Z9049 Acquired absence of other specified parts of digestive tract: Secondary | ICD-10-CM | POA: Diagnosis not present

## 2019-11-23 DIAGNOSIS — I2510 Atherosclerotic heart disease of native coronary artery without angina pectoris: Secondary | ICD-10-CM | POA: Diagnosis present

## 2019-11-23 DIAGNOSIS — Z8249 Family history of ischemic heart disease and other diseases of the circulatory system: Secondary | ICD-10-CM

## 2019-11-23 DIAGNOSIS — J9600 Acute respiratory failure, unspecified whether with hypoxia or hypercapnia: Secondary | ICD-10-CM | POA: Diagnosis not present

## 2019-11-23 DIAGNOSIS — J1282 Pneumonia due to coronavirus disease 2019: Secondary | ICD-10-CM | POA: Diagnosis present

## 2019-11-23 DIAGNOSIS — I10 Essential (primary) hypertension: Secondary | ICD-10-CM | POA: Diagnosis present

## 2019-11-23 DIAGNOSIS — E860 Dehydration: Secondary | ICD-10-CM | POA: Diagnosis present

## 2019-11-23 LAB — COMPREHENSIVE METABOLIC PANEL
ALT: 11 U/L (ref 0–44)
AST: 31 U/L (ref 15–41)
Albumin: 3.4 g/dL — ABNORMAL LOW (ref 3.5–5.0)
Alkaline Phosphatase: 39 U/L (ref 38–126)
Anion gap: 15 (ref 5–15)
BUN: 33 mg/dL — ABNORMAL HIGH (ref 8–23)
CO2: 20 mmol/L — ABNORMAL LOW (ref 22–32)
Calcium: 8.4 mg/dL — ABNORMAL LOW (ref 8.9–10.3)
Chloride: 103 mmol/L (ref 98–111)
Creatinine, Ser: 1.79 mg/dL — ABNORMAL HIGH (ref 0.61–1.24)
GFR calc Af Amer: 45 mL/min — ABNORMAL LOW (ref >60)
GFR calc non Af Amer: 39 mL/min — ABNORMAL LOW (ref >60)
Glucose, Bld: 118 mg/dL — ABNORMAL HIGH (ref 70–99)
Potassium: 3.6 mmol/L (ref 3.5–5.1)
Sodium: 138 mmol/L (ref 135–145)
Total Bilirubin: 0.7 mg/dL (ref 0.3–1.2)
Total Protein: 7.3 g/dL (ref 6.5–8.1)

## 2019-11-23 LAB — CBC WITH DIFFERENTIAL/PLATELET
Abs Immature Granulocytes: 0.05 10*3/uL (ref 0.00–0.07)
Basophils Absolute: 0 10*3/uL (ref 0.0–0.1)
Basophils Relative: 0 %
Eosinophils Absolute: 0 10*3/uL (ref 0.0–0.5)
Eosinophils Relative: 0 %
HCT: 47.4 % (ref 39.0–52.0)
Hemoglobin: 15.2 g/dL (ref 13.0–17.0)
Immature Granulocytes: 1 %
Lymphocytes Relative: 14 %
Lymphs Abs: 1 10*3/uL (ref 0.7–4.0)
MCH: 30.3 pg (ref 26.0–34.0)
MCHC: 32.1 g/dL (ref 30.0–36.0)
MCV: 94.6 fL (ref 80.0–100.0)
Monocytes Absolute: 0.9 10*3/uL (ref 0.1–1.0)
Monocytes Relative: 12 %
Neutro Abs: 5.6 10*3/uL (ref 1.7–7.7)
Neutrophils Relative %: 73 %
Platelets: 190 10*3/uL (ref 150–400)
RBC: 5.01 MIL/uL (ref 4.22–5.81)
RDW: 13.4 % (ref 11.5–15.5)
WBC: 7.6 10*3/uL (ref 4.0–10.5)
nRBC: 0 % (ref 0.0–0.2)

## 2019-11-23 LAB — LACTATE DEHYDROGENASE: LDH: 213 U/L — ABNORMAL HIGH (ref 98–192)

## 2019-11-23 LAB — LACTIC ACID, PLASMA
Lactic Acid, Venous: 1.3 mmol/L (ref 0.5–1.9)
Lactic Acid, Venous: 1.3 mmol/L (ref 0.5–1.9)

## 2019-11-23 LAB — PROCALCITONIN: Procalcitonin: 0.48 ng/mL

## 2019-11-23 LAB — TRIGLYCERIDES: Triglycerides: 150 mg/dL — ABNORMAL HIGH (ref <150)

## 2019-11-23 LAB — TROPONIN I (HIGH SENSITIVITY)
Troponin I (High Sensitivity): 78 ng/L — ABNORMAL HIGH (ref <18)
Troponin I (High Sensitivity): 82 ng/L — ABNORMAL HIGH (ref <18)

## 2019-11-23 LAB — FERRITIN: Ferritin: 985 ng/mL — ABNORMAL HIGH (ref 24–336)

## 2019-11-23 LAB — C-REACTIVE PROTEIN: CRP: 15.7 mg/dL — ABNORMAL HIGH (ref <1.0)

## 2019-11-23 LAB — SARS CORONAVIRUS 2 BY RT PCR (HOSPITAL ORDER, PERFORMED IN ~~LOC~~ HOSPITAL LAB): SARS Coronavirus 2: POSITIVE — AB

## 2019-11-23 LAB — D-DIMER, QUANTITATIVE: D-Dimer, Quant: 0.79 ug/mL-FEU — ABNORMAL HIGH (ref 0.00–0.50)

## 2019-11-23 LAB — BRAIN NATRIURETIC PEPTIDE: B Natriuretic Peptide: 93 pg/mL (ref 0.0–100.0)

## 2019-11-23 LAB — FIBRINOGEN: Fibrinogen: >800 mg/dL — ABNORMAL HIGH (ref 210–475)

## 2019-11-23 MED ORDER — ALBUTEROL SULFATE HFA 108 (90 BASE) MCG/ACT IN AERS
4.0000 | INHALATION_SPRAY | Freq: Once | RESPIRATORY_TRACT | Status: AC
  Administered 2019-11-23: 4 via RESPIRATORY_TRACT
  Filled 2019-11-23: qty 6.7

## 2019-11-23 MED ORDER — POLYETHYLENE GLYCOL 3350 17 G PO PACK: 17.0000 g | PACK | Freq: Every day | ORAL | Status: DC | PRN

## 2019-11-23 MED ORDER — SODIUM CHLORIDE 0.9 % IV SOLN
100.0000 mg | INTRAVENOUS | Status: AC
  Administered 2019-11-23 (×2): 100 mg via INTRAVENOUS
  Filled 2019-11-23 (×2): qty 20

## 2019-11-23 MED ORDER — HEPARIN SODIUM (PORCINE) 5000 UNIT/ML IJ SOLN
5000.0000 [IU] | Freq: Three times a day (TID) | INTRAMUSCULAR | Status: DC
  Administered 2019-11-24 – 2019-11-26 (×7): 5000 [IU] via SUBCUTANEOUS
  Filled 2019-11-23 (×6): qty 1

## 2019-11-23 MED ORDER — SODIUM CHLORIDE 0.9 % IV SOLN
100.0000 mg | Freq: Every day | INTRAVENOUS | Status: DC
  Administered 2019-11-24 – 2019-11-26 (×3): 100 mg via INTRAVENOUS
  Filled 2019-11-23 (×3): qty 20

## 2019-11-23 MED ORDER — IPRATROPIUM-ALBUTEROL 20-100 MCG/ACT IN AERS: 1.0000 | INHALATION_SPRAY | Freq: Four times a day (QID) | RESPIRATORY_TRACT | Status: DC | PRN

## 2019-11-23 MED ORDER — DEXAMETHASONE SODIUM PHOSPHATE 10 MG/ML IJ SOLN
6.0000 mg | INTRAMUSCULAR | Status: DC
  Administered 2019-11-24 – 2019-11-26 (×3): 6 mg via INTRAVENOUS
  Filled 2019-11-23 (×3): qty 1

## 2019-11-23 MED ORDER — ONDANSETRON HCL 4 MG/2ML IJ SOLN: 4.0000 mg | Freq: Four times a day (QID) | INTRAMUSCULAR | Status: DC | PRN

## 2019-11-23 MED ORDER — ZINC SULFATE 220 (50 ZN) MG PO CAPS
220.0000 mg | ORAL_CAPSULE | Freq: Every day | ORAL | Status: DC
  Administered 2019-11-24 – 2019-11-26 (×3): 220 mg via ORAL
  Filled 2019-11-23 (×3): qty 1

## 2019-11-23 MED ORDER — ASCORBIC ACID 500 MG PO TABS
500.0000 mg | ORAL_TABLET | Freq: Every day | ORAL | Status: DC
  Administered 2019-11-24 – 2019-11-26 (×3): 500 mg via ORAL
  Filled 2019-11-23 (×3): qty 1

## 2019-11-23 MED ORDER — ONDANSETRON HCL 4 MG PO TABS: 4.0000 mg | ORAL_TABLET | Freq: Four times a day (QID) | ORAL | Status: DC | PRN

## 2019-11-23 MED ORDER — DEXAMETHASONE SODIUM PHOSPHATE 10 MG/ML IJ SOLN
10.0000 mg | Freq: Once | INTRAMUSCULAR | Status: AC
  Administered 2019-11-23: 10 mg via INTRAVENOUS
  Filled 2019-11-23: qty 1

## 2019-11-23 MED ORDER — HYDROCOD POLST-CPM POLST ER 10-8 MG/5ML PO SUER: 5.0000 mL | Freq: Two times a day (BID) | ORAL | Status: DC | PRN

## 2019-11-23 MED ORDER — OXYCODONE HCL 5 MG PO TABS: 5.0000 mg | ORAL_TABLET | ORAL | Status: DC | PRN

## 2019-11-23 MED ORDER — ACETAMINOPHEN 325 MG PO TABS: 650.0000 mg | ORAL_TABLET | Freq: Four times a day (QID) | ORAL | Status: DC | PRN

## 2019-11-23 MED ORDER — GUAIFENESIN-DM 100-10 MG/5ML PO SYRP: 10.0000 mL | ORAL_SOLUTION | ORAL | Status: DC | PRN

## 2019-11-23 NOTE — ED Triage Notes (Signed)
Sent from urgent care for evaluation of low oxygen saturation, placed on 6 liters oxygen to keep sat at 92%

## 2019-11-23 NOTE — ED Provider Notes (Signed)
Emergency Department Provider Note   I have reviewed the triage vital signs and the nursing notes.   HISTORY  Chief Complaint Shortness of Breath   HPI Jeffery Munoz is a 64 y.o. male presents to the emergency department with cough, weakness, shortness of breath over the past 3 to 4 days.  Patient denies being vaccinated for Covid.  He has not had any known sick contacts.  He denies any chest pain.  Patient initially presented to urgent care and had an EKG which appeared abnormal and he was transferred to the emergency department for further evaluation.  On arrival the patient was found to be hypoxemic again started on oxygen.  Denies any abdominal pain, diarrhea, nausea.  No back pain.  No unilateral weakness or numbness.  Patient recently gave up smoking (days ago) and denies any known COPD history. No radiation of symptoms or modifying factors.    Past Medical History:  Diagnosis Date  . CHF (congestive heart failure) (HCC)   . Childhood asthma   . Coronary atherosclerosis of native coronary artery   . Essential hypertension, benign   . History of pneumonia    September 2015 - [facility] at [facility]   . Hyperlipidemia   . MI (myocardial infarction) (HCC)    8 years ago    Patient Active Problem List   Diagnosis Date Noted  . History of coronary artery stent placement 12/01/2013  . CHF (congestive heart failure) (HCC) 12/01/2013  . Coronary atherosclerosis of native coronary artery 11/27/2013  . Essential hypertension, benign 11/27/2013  . Mixed hyperlipidemia 11/27/2013    Past Surgical History:  Procedure Laterality Date  . APPENDECTOMY      Allergies Patient has no known allergies.  Family History  Problem Relation Age of Onset  . Heart disease Mother        Age 21-70    Social History Social History   Tobacco Use  . Smoking status: Current Every Day Smoker    Packs/day: 0.50    Types: Cigarettes    Start date: 03/03/1979  . Smokeless tobacco:  Never Used  Substance Use Topics  . Alcohol use: Yes    Comment: Occasional  . Drug use: No    Comment: Prior history of cocaine and marijuana (positive UDS 2004)    Review of Systems  Constitutional: No fever/chills. Positive fatigue and weakness (generalized).  Eyes: No visual changes. ENT: No sore throat. Cardiovascular: Denies chest pain. Respiratory: Positive shortness of breath with mild productive cough.  Gastrointestinal: No abdominal pain.  No nausea, no vomiting.  No diarrhea.  No constipation. Genitourinary: Negative for dysuria. Musculoskeletal: Negative for back pain. Skin: Negative for rash. Neurological: Negative for headaches, focal weakness or numbness.  10-point ROS otherwise negative.  ____________________________________________   PHYSICAL EXAM:  VITAL SIGNS: ED Triage Vitals  Enc Vitals Group     BP 11/23/19 1624 (!) 147/110     Pulse Rate 11/23/19 1624 72     Resp 11/23/19 1624 (!) 26     Temp 11/23/19 1624 99 F (37.2 C)     Temp Source 11/23/19 1624 Oral     SpO2 11/23/19 1624 (!) 89 %   Constitutional: Alert and oriented. Well appearing and in no acute distress. Eyes: Conjunctivae are normal. Head: Atraumatic. Nose: No congestion/rhinnorhea. Mouth/Throat: Mucous membranes are moist.   Neck: No stridor.   Cardiovascular: Normal rate, regular rhythm. Good peripheral circulation. Grossly normal heart sounds.   Respiratory: Normal respiratory effort.  No retractions.  Lungs with faint end-expiratory wheezing and crackles at the bases. Good air entry.  Gastrointestinal: Soft and nontender. No distention.  Musculoskeletal: No lower extremity tenderness nor edema. No gross deformities of extremities. Neurologic:  Normal speech and language. No gross focal neurologic deficits are appreciated.  Skin:  Skin is warm, dry and intact. No rash noted.  ____________________________________________   LABS (all labs ordered are listed, but only abnormal  results are displayed)  Labs Reviewed  SARS CORONAVIRUS 2 BY RT PCR (HOSPITAL ORDER, PERFORMED IN [facility]) - Abnormal; Notable for the following components:      Result Value   SARS Coronavirus 2 POSITIVE (*)    All other components within normal limits  COMPREHENSIVE METABOLIC PANEL - Abnormal; Notable for the following components:   CO2 20 (*)    Glucose, Bld 118 (*)    BUN 33 (*)    Creatinine, Ser 1.79 (*)    Calcium 8.4 (*)    Albumin 3.4 (*)    GFR calc non Af Amer 39 (*)    GFR calc Af Amer 45 (*)    All other components within normal limits  TRIGLYCERIDES - Abnormal; Notable for the following components:   Triglycerides 150 (*)    All other components within normal limits  TROPONIN I (HIGH SENSITIVITY) - Abnormal; Notable for the following components:   Troponin I (High Sensitivity) 78 (*)    All other components within normal limits  CULTURE, BLOOD (ROUTINE X 2)  CULTURE, BLOOD (ROUTINE X 2)  BRAIN NATRIURETIC PEPTIDE  CBC WITH DIFFERENTIAL/PLATELET  LACTIC ACID, PLASMA  LACTIC ACID, PLASMA  D-DIMER, QUANTITATIVE (NOT AT [facility])  PROCALCITONIN  LACTATE DEHYDROGENASE  FERRITIN  FIBRINOGEN  C-REACTIVE PROTEIN  TROPONIN I (HIGH SENSITIVITY)  TROPONIN I (HIGH SENSITIVITY)   ____________________________________________  EKG   EKG Interpretation  Date/Time:  Monday November 23 2019 16:23:35 EDT Ventricular Rate:  73 PR Interval:    QRS Duration: 82 QT Interval:  387 QTC Calculation: 427 R Axis:   76 Text Interpretation: Sinus rhythm Left atrial enlargement Left ventricular hypertrophy Anterior Q waves, possibly due to LVH Abnormal T, consider ischemia, diffuse leads Baseline wander in lead(s) V6 Similar to 2019 tracing No STEMI Confirmed by Alona Bene [phone]) on 11/23/2019 4:29:52 PM       ____________________________________________  RADIOLOGY  DG Chest Portable 1 View  Result Date: 11/23/2019 CLINICAL DATA:  Weakness and cough. EXAM:  PORTABLE CHEST 1 VIEW COMPARISON:  Jul 28, 2017 FINDINGS: Mild areas of atelectasis and/or infiltrate are seen within the bilateral lung bases. There is no evidence of a pleural effusion or pneumothorax. The heart size and mediastinal contours are within normal limits. The visualized skeletal structures are unremarkable. IMPRESSION: Mild bibasilar atelectasis and/or infiltrate. Electronically Signed   By: Aram Candela M.D.   On: 11/23/2019 18:05    ____________________________________________   PROCEDURES  Procedure(s) performed:   Procedures  CRITICAL CARE Performed by: Maia Plan Total critical care time: 35 minutes Critical care time was exclusive of separately billable procedures and treating other patients. Critical care was necessary to treat or prevent imminent or life-threatening deterioration. Critical care was time spent personally by me on the following activities: development of treatment plan with patient and/or surrogate as well as nursing, discussions with consultants, evaluation of patient's response to treatment, examination of patient, obtaining history from patient or surrogate, ordering and performing treatments and interventions, ordering and review of laboratory studies, ordering and review of radiographic studies,  pulse oximetry and re-evaluation of patient's condition.  Alona Bene, MD Emergency Medicine  ____________________________________________   INITIAL IMPRESSION / ASSESSMENT AND PLAN / ED COURSE  Pertinent labs & imaging results that were available during my care of the patient were reviewed by me and considered in my medical decision making (see chart for details).   Patient presents emergency department with hypoxemia along with cough and shortness of breath with weakness.  Suspicion for COVID-19 is elevated.  Patient does have CHF history but unknown EF upon chart review in our system. Does not appear clinically volume overloaded. Will give  albuterol inh with some wheezing on exam and smoking history. EKG similar to prior tracing as interpreted by me as above. Patient placed on 6L [place] O2 and comfortable appearing.   COVID positive. Patient on 6L [place] O2 and tolerating that well. Will start Decadron and antiviral meds started.   Discussed patient's case with [facility] to request admission. Patient and family (if present) updated with plan. Care transferred to [facility] service.  I reviewed all nursing notes, vitals, pertinent old records, EKGs, labs, imaging (as available).  ____________________________________________  FINAL CLINICAL IMPRESSION(S) / ED DIAGNOSES  Final diagnoses:  Acute respiratory failure with hypoxia (HCC)  COVID-19    MEDICATIONS GIVEN DURING THIS VISIT:  Medications  remdesivir 100 mg in sodium chloride 0.9 % 100 mL IVPB (has no administration in time range)  albuterol (VENTOLIN HFA) 108 (90 Base) MCG/ACT inhaler 4 puff (4 puffs Inhalation Given 11/23/19 1701)  dexamethasone (DECADRON) injection 10 mg (10 mg Intravenous Given 11/23/19 1948)  remdesivir 100 mg in sodium chloride 0.9 % 100 mL IVPB (0 mg Intravenous Stopped 11/23/19 2116)    Note:  This document was prepared using Dragon voice recognition software and may include unintentional dictation errors.  Alona Bene, MD, [facility] Emergency Medicine    Aamina Skiff, Arlyss Repress, MD 11/23/19 2225

## 2019-11-23 NOTE — ED Notes (Signed)
Flora Lipps DAUGHTER [phone]

## 2019-11-23 NOTE — ED Notes (Signed)
States he feels better at present , remains on oxygen at 6 ;liters via cannula. Able to speak with family member on phone

## 2019-11-23 NOTE — H&P (Signed)
TRH H&P    Patient Demographics:    Jeffery Munoz, is a 64 y.o. male  MRN: [id number]  DOB - 08/19/1955  Admit Date - 11/23/2019  Referring MD/NP/PA: Long  Outpatient Primary MD for the patient is Oval Linsey, MD  Patient coming from: Home  Chief complaint- Dyspnea   HPI:    Jeffery Munoz  is a 64 y.o. male, with history of coronary artery disease, hyperlipidemia, essential hypertension, CHF, and more presents to the ED with a chief complaint of dyspnea.  Patient reports that he presented to urgent care for difficulty breathing.  He does not know when the symptoms start but after asking a question several different ways he said maybe 2 days ago.  He reports that at the urgent care they told him that he had an EKG change and sent him to the ER.  Patient reports that with this dyspnea he has had a cough.  Productive of white sputum, but no blood.  Patient reports a decreased appetite.  His smell and taste are intact.  He has had no nausea vomiting, abdominal pain, body aches, or fever.  He is not sure if he was vaccinated or not.  Patient is not on oxygen at baseline.  Overall patient is a poor historian, which could be due to the late hour, and waking him from sleep.  He reports that at this time he does not feel short of breath with the oxygen on.  He is not coughing when I am in the room.  Patient is a former smoker, and reports that he quit smoking 2 weeks ago.  Patient has no other complaints at this time.  In the ED Temperature 99, heart rate 73, respiratory rate between 19 and 33, blood pressure 125/85, satting at 97% on 6 L nasal cannula No leukocytosis with a white blood cell count of 7.6 AKI with a creatinine of 1.79 baseline is 1.2-1.3 EKG shows a heart rate of 73, sinus rhythm, QTC 427, diffuse T wave inversions including lead I lead to lead III, aVF, V1, V2 V3 and V6 Inflammatory markers drawn  but pending Decadron remdesivir and albuterol given in the ED BNP 93    Review of systems:    In addition to the HPI above,  No Fever-chills, No Headache, No changes with Vision or hearing, No problems swallowing food or Liquids, No Chest pain, admits to cough and shortness of Breath, No Abdominal pain, No Nausea or Vomiting, bowel movements are regular, No Blood in stool or Urine, No dysuria, No new skin rashes or bruises, No new joints pains-aches,  No new weakness, tingling, numbness in any extremity, No recent weight gain or loss, No polyuria, polydypsia or polyphagia, No significant Mental Stressors.  All other systems reviewed and are negative.    Past History of the following :    Past Medical History:  Diagnosis Date  . CHF (congestive heart failure) (HCC)   . Childhood asthma   . Coronary atherosclerosis of native coronary artery   . Essential hypertension, benign   .  History of pneumonia    September 2015 - [facility]   . Hyperlipidemia   . MI (myocardial infarction) (HCC)    8 years ago      Past Surgical History:  Procedure Laterality Date  . APPENDECTOMY        Social History:      Social History   Tobacco Use  . Smoking status: Current Every Day Smoker    Packs/day: 0.50    Types: Cigarettes    Start date: 03/03/1979  . Smokeless tobacco: Never Used  Substance Use Topics  . Alcohol use: Yes    Comment: Occasional       Family History :     Family History  Problem Relation Age of Onset  . Heart disease Mother        Age 65-70      Home Medications:   Prior to Admission medications   Not on File     Allergies:    No Known Allergies   Physical Exam:   Vitals  Blood pressure 125/85, pulse 72, temperature 99 F (37.2 C), temperature source Oral, resp. rate 19, SpO2 97 %.  1.  General: Lying supine in bed, no acute distress  2. Psychiatric: Oriented x3, but difficulty with answering  questions Cooperative, behavior is normal for situation  3. Neurologic: Cranial nerves II through XII are grossly intact, no focal deficit on limited exam, moves all 4 extremities voluntarily  4. HEENMT:  Head is atraumatic, normocephalic, pupils are reactive to light, sclera are clear, neck is supple, trachea is midline, mucous membranes are moist  5. Respiratory : Wheezing in the left lung field, right lung field is clear No accessory muscle use On 6 L nasal cannula  6. Cardiovascular : Heart rate and rhythm are regular, no peripheral edema, no murmurs rubs or gallops  7. Gastrointestinal:  Abdomen is soft, nondistended, nontender to palpation  8. Skin:  No acute lesions on limited skin exam  9.Musculoskeletal:  No peripheral edema or acute deformity    Data Review:    CBC Recent Labs  Lab 11/23/19 1821  WBC 7.6  HGB 15.2  HCT 47.4  PLT 190  MCV 94.6  MCH 30.3  MCHC 32.1  RDW 13.4  LYMPHSABS 1.0  MONOABS 0.9  EOSABS 0.0  BASOSABS 0.0   ------------------------------------------------------------------------------------------------------------------  Results for orders placed or performed during the hospital encounter of 11/23/19 (from the past 48 hour(s))  SARS Coronavirus 2 by RT PCR (hospital order, performed in [facility] lab) Nasopharyngeal Nasopharyngeal Swab     Status: Abnormal   Collection Time: 11/23/19  4:34 PM   Specimen: Nasopharyngeal Swab  Result Value Ref Range   SARS Coronavirus 2 POSITIVE (A) NEGATIVE    Comment: RESULT CALLED TO, READ BACK BY AND VERIFIED WITH: BETHEL,S ON 11/23/19 AT 1820 BY LOY,C (NOTE) SARS-CoV-2 target nucleic acids are DETECTED  SARS-CoV-2 RNA is generally detectable in upper respiratory specimens  during the acute phase of infection.  Positive results are indicative  of the presence of the identified virus, but do not rule out bacterial infection or co-infection with other pathogens not detected by the  test.  Clinical correlation with patient history and  other diagnostic information is necessary to determine patient infection status.  The expected result is negative.  Fact Sheet for Patients:   BoilerBrush.com.cyhttps://www.fda.gov/media/136312/download   Fact Sheet for Healthcare Providers:   https://pope.com/https://www.fda.gov/media/136313/download    This test is not yet approved or cleared by the  [place] [employer] and  has been authorized for detection and/or diagnosis of SARS-CoV-2 by FDA under an TEFL teacher (EUA).  This EUA will remain in effect (meaning this  test can be used) for the duration of  the COVID-19 declaration under Section 564(b)(1) of the Act, 21 U.S.C. section 360-bbb-3(b)(1), unless the authorization is terminated or revoked sooner.  Performed at [facility], [address] [place], [place] [address]   Comprehensive metabolic panel     Status: Abnormal   Collection Time: 11/23/19  6:21 PM  Result Value Ref Range   Sodium 138 135 - 145 mmol/L   Potassium 3.6 3.5 - 5.1 mmol/L   Chloride 103 98 - 111 mmol/L   CO2 20 (L) 22 - 32 mmol/L   Glucose, Bld 118 (H) 70 - 99 mg/dL    Comment: Glucose reference range applies only to samples taken after fasting for at least 8 hours.   BUN 33 (H) 8 - 23 mg/dL   Creatinine, Ser 6.04 (H) 0.61 - 1.24 mg/dL   Calcium 8.4 (L) 8.9 - 10.3 mg/dL   Total Protein 7.3 6.5 - 8.1 g/dL   Albumin 3.4 (L) 3.5 - 5.0 g/dL   AST 31 15 - 41 U/L   ALT 11 0 - 44 U/L   Alkaline Phosphatase 39 38 - 126 U/L   Total Bilirubin 0.7 0.3 - 1.2 mg/dL   GFR calc non Af Amer 39 (L) >60 mL/min   GFR calc Af Amer 45 (L) >60 mL/min   Anion gap 15 5 - 15    Comment: Performed at [facility], [address] [place], [place] [address]  Brain natriuretic peptide     Status: None   Collection Time: 11/23/19  6:21 PM  Result Value Ref Range   B Natriuretic Peptide 93.0 0.0 - 100.0 pg/mL    Comment: Performed at [facility], [address], [place], [place] [address]   Troponin I (High Sensitivity)     Status: Abnormal   Collection Time: 11/23/19  6:21 PM  Result Value Ref Range   Troponin I (High Sensitivity) 78 (H) <18 ng/L    Comment: (NOTE) Elevated high sensitivity troponin I (hsTnI) values and significant  changes across serial measurements may suggest ACS but many other  chronic and acute conditions are known to elevate hsTnI results.  Refer to the "Links" section for chest pain algorithms and additional  guidance. Performed at [facility], [address] [place], [place] [address]   CBC with Differential     Status: None   Collection Time: 11/23/19  6:21 PM  Result Value Ref Range   WBC 7.6 4.0 - 10.5 K/uL   RBC 5.01 4.22 - 5.81 MIL/uL   Hemoglobin 15.2 13.0 - 17.0 g/dL   HCT 62.1 39 - 52 %   MCV 94.6 80.0 - 100.0 fL   MCH 30.3 26.0 - 34.0 pg   MCHC 32.1 30.0 - 36.0 g/dL   RDW 30.8 65.7 - 84.6 %   Platelets 190 150 - 400 K/uL   nRBC 0.0 0.0 - 0.2 %   Neutrophils Relative % 73 %   Neutro Abs 5.6 1.7 - 7.7 K/uL   Lymphocytes Relative 14 %   Lymphs Abs 1.0 0.7 - 4.0 K/uL   Monocytes Relative 12 %   Monocytes Absolute 0.9 0 - 1 K/uL   Eosinophils Relative 0 %   Eosinophils Absolute 0.0 0 - 0 K/uL   Basophils Relative 0 %  Basophils Absolute 0.0 0 - 0 K/uL   Immature Granulocytes 1 %   Abs Immature Granulocytes 0.05 0.00 - 0.07 K/uL    Comment: Performed at [facility], [address], [place], [place] [address]  Lactic acid, plasma     Status: None   Collection Time: 11/23/19  6:21 PM  Result Value Ref Range   Lactic Acid, Venous 1.3 0.5 - 1.9 mmol/L    Comment: Performed at [facility], [address] [place], [place] [address]  Triglycerides     Status: Abnormal   Collection Time: 11/23/19  6:21 PM  Result Value Ref Range   Triglycerides 150 (H) <150 mg/dL    Comment: Performed at [facility], [address], [place], [place] [address]    Chemistries  Recent Labs  Lab 11/23/19 1821  NA 138  K 3.6  CL 103  CO2 20*   GLUCOSE 118*  BUN 33*  CREATININE 1.79*  CALCIUM 8.4*  AST 31  ALT 11  ALKPHOS 39  BILITOT 0.7   ------------------------------------------------------------------------------------------------------------------  ------------------------------------------------------------------------------------------------------------------ GFR: CrCl cannot be calculated (Unknown ideal weight.). Liver Function Tests: Recent Labs  Lab 11/23/19 1821  AST 31  ALT 11  ALKPHOS 39  BILITOT 0.7  PROT 7.3  ALBUMIN 3.4*   No results for input(s): LIPASE, AMYLASE in the last 168 hours. No results for input(s): AMMONIA in the last 168 hours. Coagulation Profile: No results for input(s): INR, PROTIME in the last 168 hours. Cardiac Enzymes: No results for input(s): CKTOTAL, CKMB, CKMBINDEX, TROPONINI in the last 168 hours. BNP (last 3 results) No results for input(s): PROBNP in the last 8760 hours. HbA1C: No results for input(s): HGBA1C in the last 72 hours. CBG: No results for input(s): GLUCAP in the last 168 hours. Lipid Profile: Recent Labs    11/23/19 1821  TRIG 150*   Thyroid Function Tests: No results for input(s): TSH, T4TOTAL, FREET4, T3FREE, THYROIDAB in the last 72 hours. Anemia Panel: No results for input(s): VITAMINB12, FOLATE, FERRITIN, TIBC, IRON, RETICCTPCT in the last 72 hours.  --------------------------------------------------------------------------------------------------------------- Urine analysis:    Component Value Date/Time   COLORURINE AMBER (A) 04/23/2014 0210   APPEARANCEUR HAZY (A) 04/23/2014 0210   LABSPEC >1.030 (H) 04/23/2014 0210   PHURINE 5.5 04/23/2014 0210   GLUCOSEU NEGATIVE 04/23/2014 0210   HGBUR SMALL (A) 04/23/2014 0210   BILIRUBINUR SMALL (A) 04/23/2014 0210   KETONESUR NEGATIVE 04/23/2014 0210   PROTEINUR TRACE (A) 04/23/2014 0210   UROBILINOGEN 0.2 04/23/2014 0210   NITRITE NEGATIVE 04/23/2014 0210   LEUKOCYTESUR SMALL (A) 04/23/2014  0210      Imaging Results:    DG Chest Portable 1 View  Result Date: 11/23/2019 CLINICAL DATA:  Weakness and cough. EXAM: PORTABLE CHEST 1 VIEW COMPARISON:  Jul 28, 2017 FINDINGS: Mild areas of atelectasis and/or infiltrate are seen within the bilateral lung bases. There is no evidence of a pleural effusion or pneumothorax. The heart size and mediastinal contours are within normal limits. The visualized skeletal structures are unremarkable. IMPRESSION: Mild bibasilar atelectasis and/or infiltrate. Electronically Signed   By: Aram Candela M.D.   On: 11/23/2019 18:05    My personal review of EKG: Rhythm NSR, Rate 73 /min, QTc 427 ,diffuse T wave inversions   Assessment & Plan:    Active Problems:   Pneumonia due to COVID-19 virus   1. Respiratory failure with hypoxia  1. O2 sats down to 80s 2. 2/2 covid pneumonia 3. Continue inhalers, steroids, and antivirals 4. Wean off  O2 as tolerated 5. Admit to step down given 6L [place] requirement, and high risk of further decompensation 2. Covid 19 pneumonia 1. Covid positive 2. Inflammatory markers pending 3. Continue inhalers, steroids, and antivirals 3. Elevated troponin 1. Likely demand ischemia 2. Trend 3. Diffuse t wave inversions on EKG 4. Monitor on tele 4. AKI 1. Baseline creatinine is 1.2-1.3 2. Today creatinine is 1.79 3. Patient has history of CHF, gentle hydration 4. Continue to monitor   DVT Prophylaxis-Heparin- SCDs   AM Labs Ordered, also please review Full Orders  Family Communication: No family at bedside Code Status: Full code  Admission status:Inpatient :The appropriate admission status for this patient is INPATIENT. Inpatient status is judged to be reasonable and necessary in order to provide the required intensity of service to ensure the patient's safety. The patient's presenting symptoms, physical exam findings, and initial radiographic and laboratory data in the context of their chronic comorbidities is  felt to place them at high risk for further clinical deterioration. Furthermore, it is not anticipated that the patient will be medically stable for discharge from the hospital within 2 midnights of admission. The following factors support the admission status of inpatient.     The patient's presenting symptoms include dyspnea and "EKG changes" The worrisome physical exam findings include hypoxia with O2 sats down to 80s The initial radiographic and laboratory data are worrisome because of CXR = bibasilar infiltrate The chronic co-morbidities include CAD, HTN, CAD, CHF       * I certify that at the point of admission it is my clinical judgment that the patient will require inpatient hospital care spanning beyond 2 midnights from the point of admission due to high intensity of service, high risk for further deterioration and high frequency of surveillance required.*  Time spent in minutes : 64   Thorne Wirz B Zierle-Ghosh DO

## 2019-11-23 NOTE — ED Triage Notes (Signed)
Pt presents with c/o weakness for past week and then developed dry cough 3 days ago

## 2019-11-23 NOTE — ED Triage Notes (Signed)
Patient is being discharged from the Urgent Care and sent to the Emergency Department via ems . Per K Avegno, patient is in need of higher level of care due to hypoxia and abnormal EKG. Patient is aware and verbalizes understanding of plan of care.  Vitals:   11/23/19 1501  BP: 135/81  Pulse: 85  Resp: 20  Temp: 99.6 F (37.6 C)  SpO2: (S) (!) 84%

## 2019-11-23 NOTE — ED Triage Notes (Signed)
Pt placed on 6 L 02 20 gauge iv in left forearm with NS initiated before transport

## 2019-11-24 DIAGNOSIS — J9600 Acute respiratory failure, unspecified whether with hypoxia or hypercapnia: Secondary | ICD-10-CM

## 2019-11-24 DIAGNOSIS — U071 COVID-19: Secondary | ICD-10-CM

## 2019-11-24 DIAGNOSIS — N179 Acute kidney failure, unspecified: Secondary | ICD-10-CM | POA: Diagnosis present

## 2019-11-24 LAB — CBC WITH DIFFERENTIAL/PLATELET
Abs Immature Granulocytes: 0.07 10*3/uL (ref 0.00–0.07)
Basophils Absolute: 0 10*3/uL (ref 0.0–0.1)
Basophils Relative: 0 %
Eosinophils Absolute: 0 10*3/uL (ref 0.0–0.5)
Eosinophils Relative: 0 %
HCT: 48.5 % (ref 39.0–52.0)
Hemoglobin: 15.2 g/dL (ref 13.0–17.0)
Immature Granulocytes: 1 %
Lymphocytes Relative: 12 %
Lymphs Abs: 0.8 10*3/uL (ref 0.7–4.0)
MCH: 30.5 pg (ref 26.0–34.0)
MCHC: 31.3 g/dL (ref 30.0–36.0)
MCV: 97.4 fL (ref 80.0–100.0)
Monocytes Absolute: 0.4 10*3/uL (ref 0.1–1.0)
Monocytes Relative: 7 %
Neutro Abs: 5.3 10*3/uL (ref 1.7–7.7)
Neutrophils Relative %: 80 %
Platelets: 219 10*3/uL (ref 150–400)
RBC: 4.98 MIL/uL (ref 4.22–5.81)
RDW: 13.6 % (ref 11.5–15.5)
WBC: 6.5 10*3/uL (ref 4.0–10.5)
nRBC: 0 % (ref 0.0–0.2)

## 2019-11-24 LAB — COMPREHENSIVE METABOLIC PANEL
ALT: 10 U/L (ref 0–44)
AST: 28 U/L (ref 15–41)
Albumin: 3 g/dL — ABNORMAL LOW (ref 3.5–5.0)
Alkaline Phosphatase: 38 U/L (ref 38–126)
Anion gap: 11 (ref 5–15)
BUN: 40 mg/dL — ABNORMAL HIGH (ref 8–23)
CO2: 26 mmol/L (ref 22–32)
Calcium: 8.6 mg/dL — ABNORMAL LOW (ref 8.9–10.3)
Chloride: 103 mmol/L (ref 98–111)
Creatinine, Ser: 1.73 mg/dL — ABNORMAL HIGH (ref 0.61–1.24)
GFR calc Af Amer: 47 mL/min — ABNORMAL LOW (ref >60)
GFR calc non Af Amer: 41 mL/min — ABNORMAL LOW (ref >60)
Glucose, Bld: 170 mg/dL — ABNORMAL HIGH (ref 70–99)
Potassium: 4.6 mmol/L (ref 3.5–5.1)
Sodium: 140 mmol/L (ref 135–145)
Total Bilirubin: 0.5 mg/dL (ref 0.3–1.2)
Total Protein: 7 g/dL (ref 6.5–8.1)

## 2019-11-24 LAB — HIV ANTIBODY (ROUTINE TESTING W REFLEX): HIV Screen 4th Generation wRfx: NONREACTIVE

## 2019-11-24 LAB — C-REACTIVE PROTEIN: CRP: 13.9 mg/dL — ABNORMAL HIGH (ref <1.0)

## 2019-11-24 LAB — MAGNESIUM: Magnesium: 2.6 mg/dL — ABNORMAL HIGH (ref 1.7–2.4)

## 2019-11-24 LAB — FERRITIN: Ferritin: 1177 ng/mL — ABNORMAL HIGH (ref 24–336)

## 2019-11-24 LAB — TROPONIN I (HIGH SENSITIVITY): Troponin I (High Sensitivity): 49 ng/L — ABNORMAL HIGH (ref <18)

## 2019-11-24 LAB — D-DIMER, QUANTITATIVE: D-Dimer, Quant: 0.8 ug/mL-FEU — ABNORMAL HIGH (ref 0.00–0.50)

## 2019-11-24 MED ORDER — SODIUM CHLORIDE 0.9 % IV SOLN: INTRAVENOUS | Status: AC

## 2019-11-24 NOTE — ED Notes (Signed)
Pt given breakfast tray

## 2019-11-24 NOTE — Progress Notes (Signed)
PROGRESS NOTE    Jeffery Munoz  RJJ:[id number] DOB: 09/24/1955 DOA: 11/23/2019 PCP: Oval Linsey, MD    Brief Narrative:  Jeffery Munoz  is a 64 y.o. male, with history of coronary artery disease, hyperlipidemia, essential hypertension, CHF, and more presents to the ED with a chief complaint of dyspnea.  Patient reports that he presented to urgent care for difficulty breathing.  He does not know when the symptoms start but after asking a question several different ways he said maybe 2 days ago.  He reports that at the urgent care they told him that he had an EKG change and sent him to the ER.  Patient reports that with this dyspnea he has had a cough.  Productive of white sputum, but no blood.  Patient reports a decreased appetite.  His smell and taste are intact.  He has had no nausea vomiting, abdominal pain, body aches, or fever.  He is not sure if he was vaccinated or not.  Patient is not on oxygen at baseline.  Overall patient is a poor historian, which could be due to the late hour, and waking him from sleep.  He reports that at this time he does not feel short of breath with the oxygen on.  He is not coughing when I am in the room.  Patient is a former smoker, and reports that he quit smoking 2 weeks ago.  Patient has no other complaints at this time.  In the ED Temperature 99, heart rate 73, respiratory rate between 19 and 33, blood pressure 125/85, satting at 97% on 6 L nasal cannula No leukocytosis with a white blood cell count of 7.6 AKI with a creatinine of 1.79 baseline is 1.2-1.3 EKG shows a heart rate of 73, sinus rhythm, QTC 427, diffuse T wave inversions including lead I lead to lead III, aVF, V1, V2 V3 and V6 Inflammatory markers drawn but pending Decadron remdesivir and albuterol given in the ED BNP 93   Assessment & Plan:   Active Problems:   Essential hypertension, benign   Pneumonia due to COVID-19 virus   Acute respiratory failure due to COVID-19 [facility])    AKI (acute kidney injury) (HCC)   Acute respiratory failure with hypoxia secondary to COVID-19 pneumonia. -Currently on 6 L of oxygen, wean down as tolerated -Remdesivir day 2 of 5 -Steroids day 2 -Continue bronchodilators -Ferritin 95 > 1177 -CRP 15.7 > 13.9 -Procalcitonin 0.48 -D-dimer 0.79 > 0.8  Acute kidney injury -Likely secondary to dehydration -Continue gentle hydration follow-up renal function  CAD s/p stenting in 2002 -no chest pain at this time  DVT prophylaxis: heparin injection 5,000 Units Start: 11/23/19 2330 SCDs Start: 11/23/19 2328  Code Status: Full code Family Communication: Updated Cloyde Reams Disposition Plan: Status is: Inpatient  Remains inpatient appropriate because:Inpatient level of care appropriate due to severity of illness   Dispo: The patient is from: Home              Anticipated d/c is to: Home              Anticipated d/c date is: 1 day              Patient currently is not medically stable to d/c.    Consultants:     Procedures:     Antimicrobials:       Subjective: Still has some shortness of breath, although he is feeling better.  Still has a productive cough.  Objective: Vitals:   11/24/19  1630 11/24/19 1800 11/24/19 1830 11/24/19 1900  BP: (!) 141/90 (!) 138/99 (!) 144/83 131/87  Pulse: (!) 59 65 64 63  Resp: (!) 30 19 (!) 28 (!) 29  Temp:      TempSrc:      SpO2: 100% 98% 96% 98%    Intake/Output Summary (Last 24 hours) at 11/24/2019 1951 Last data filed at 11/24/2019 1140 Gross per 24 hour  Intake 200 ml  Output --  Net 200 ml   There were no vitals filed for this visit.  Examination:  General exam: Appears calm and comfortable  Respiratory system: Clear to auscultation. Respiratory effort normal. Cardiovascular system: S1 & S2 heard, RRR. No JVD, murmurs, rubs, gallops or clicks. No pedal edema. Gastrointestinal system: Abdomen is nondistended, soft and nontender. No organomegaly or masses felt.  Normal bowel sounds heard. Central nervous system: Alert and oriented. No focal neurological deficits. Extremities: Symmetric 5 x 5 power. Skin: No rashes, lesions or ulcers Psychiatry: Judgement and insight appear normal. Mood & affect appropriate.     Data Reviewed: I have personally reviewed following labs and imaging studies  CBC: Recent Labs  Lab 11/23/19 1821 11/24/19 0625  WBC 7.6 6.5  NEUTROABS 5.6 5.3  HGB 15.2 15.2  HCT 47.4 48.5  MCV 94.6 97.4  PLT 190 219   Basic Metabolic Panel: Recent Labs  Lab 11/23/19 1821 11/24/19 0625  NA 138 140  K 3.6 4.6  CL 103 103  CO2 20* 26  GLUCOSE 118* 170*  BUN 33* 40*  CREATININE 1.79* 1.73*  CALCIUM 8.4* 8.6*  MG  --  2.6*   GFR: CrCl cannot be calculated (Unknown ideal weight.). Liver Function Tests: Recent Labs  Lab 11/23/19 1821 11/24/19 0625  AST 31 28  ALT 11 10  ALKPHOS 39 38  BILITOT 0.7 0.5  PROT 7.3 7.0  ALBUMIN 3.4* 3.0*   No results for input(s): LIPASE, AMYLASE in the last 168 hours. No results for input(s): AMMONIA in the last 168 hours. Coagulation Profile: No results for input(s): INR, PROTIME in the last 168 hours. Cardiac Enzymes: No results for input(s): CKTOTAL, CKMB, CKMBINDEX, TROPONINI in the last 168 hours. BNP (last 3 results) No results for input(s): PROBNP in the last 8760 hours. HbA1C: No results for input(s): HGBA1C in the last 72 hours. CBG: No results for input(s): GLUCAP in the last 168 hours. Lipid Profile: Recent Labs    11/23/19 1821  TRIG 150*   Thyroid Function Tests: No results for input(s): TSH, T4TOTAL, FREET4, T3FREE, THYROIDAB in the last 72 hours. Anemia Panel: Recent Labs    11/23/19 1821 11/24/19 0625  FERRITIN 985* 1,177*   Sepsis Labs: Recent Labs  Lab 11/23/19 1821 11/23/19 2145  PROCALCITON 0.48  --   LATICACIDVEN 1.3 1.3    Recent Results (from the past 240 hour(s))  SARS Coronavirus 2 by RT PCR (hospital order, performed in [facility] hospital lab) Nasopharyngeal Nasopharyngeal Swab     Status: Abnormal   Collection Time: 11/23/19  4:34 PM   Specimen: Nasopharyngeal Swab  Result Value Ref Range Status   SARS Coronavirus 2 POSITIVE (A) NEGATIVE Final    Comment: RESULT CALLED TO, READ BACK BY AND VERIFIED WITH: BETHEL,S ON 11/23/19 AT 1820 BY LOY,C (NOTE) SARS-CoV-2 target nucleic acids are DETECTED  SARS-CoV-2 RNA is generally detectable in upper respiratory specimens  during the acute phase of infection.  Positive results are indicative  of the presence of the identified virus, but do not  rule out bacterial infection or co-infection with other pathogens not detected by the test.  Clinical correlation with patient history and  other diagnostic information is necessary to determine patient infection status.  The expected result is negative.  Fact Sheet for Patients:   BoilerBrush.com.cy   Fact Sheet for Healthcare Providers:   https://pope.com/    This test is not yet approved or cleared by the [place] FDA and  has been authorized for detection and/or diagnosis of SARS-CoV-2 by FDA under an Emergency Use Authorization (EUA).  This EUA will remain in effect (meaning this  test can be used) for the duration of  the COVID-19 declaration under Section 564(b)(1) of the Act, 21 U.S.C. section 360-bbb-3(b)(1), unless the authorization is terminated or revoked sooner.  Performed at [facility], [address] [place], [place] [address]   Blood Culture (routine x 2)     Status: None (Preliminary result)   Collection Time: 11/23/19  6:21 PM   Specimen: Right Antecubital; Blood  Result Value Ref Range Status   Specimen Description RIGHT ANTECUBITAL  Final   Special Requests   Final    BOTTLES DRAWN AEROBIC AND ANAEROBIC Blood Culture adequate volume   Culture   Final    NO GROWTH < 12 HOURS Performed at [facility], [address] [place], [place]  [address]    Report Status PENDING  Incomplete  Blood Culture (routine x 2)     Status: None (Preliminary result)   Collection Time: 11/23/19  6:21 PM   Specimen: Right Antecubital; Blood  Result Value Ref Range Status   Specimen Description RIGHT ANTECUBITAL  Final   Special Requests   Final    BOTTLES DRAWN AEROBIC AND ANAEROBIC Blood Culture adequate volume   Culture   Final    NO GROWTH < 12 HOURS Performed at [facility], [address] [place], [place] [address]    Report Status PENDING  Incomplete         Radiology Studies: DG Chest Portable 1 View  Result Date: 11/23/2019 CLINICAL DATA:  Weakness and cough. EXAM: PORTABLE CHEST 1 VIEW COMPARISON:  Jul 28, 2017 FINDINGS: Mild areas of atelectasis and/or infiltrate are seen within the bilateral lung bases. There is no evidence of a pleural effusion or pneumothorax. The heart size and mediastinal contours are within normal limits. The visualized skeletal structures are unremarkable. IMPRESSION: Mild bibasilar atelectasis and/or infiltrate. Electronically Signed   By: Aram Candela M.D.   On: 11/23/2019 18:05        Scheduled Meds: . vitamin C  500 mg Oral Daily  . dexamethasone (DECADRON) injection  6 mg Intravenous Q24H  . heparin  5,000 Units Subcutaneous Q8H  . zinc sulfate  220 mg Oral Daily   Continuous Infusions: . sodium chloride    . remdesivir 100 mg in NS 100 mL Stopped (11/24/19 1140)     LOS: 1 day    Time spent:    Erick Blinks, MD [facility]   If 7PM-7AM, please contact night-coverage [email or web address]  11/24/2019, 7:51 PM

## 2019-11-24 NOTE — ED Notes (Signed)
Meal tray provided.

## 2019-11-24 NOTE — ED Notes (Signed)
Attempted to call report

## 2019-11-24 NOTE — ED Notes (Signed)
Ice water provided to the pt.

## 2019-11-25 ENCOUNTER — Other Ambulatory Visit: Payer: Self-pay

## 2019-11-25 DIAGNOSIS — J9601 Acute respiratory failure with hypoxia: Secondary | ICD-10-CM

## 2019-11-25 DIAGNOSIS — I10 Essential (primary) hypertension: Secondary | ICD-10-CM

## 2019-11-25 DIAGNOSIS — I2510 Atherosclerotic heart disease of native coronary artery without angina pectoris: Secondary | ICD-10-CM

## 2019-11-25 LAB — CBC WITH DIFFERENTIAL/PLATELET
Abs Immature Granulocytes: 0.07 10*3/uL (ref 0.00–0.07)
Basophils Absolute: 0 10*3/uL (ref 0.0–0.1)
Basophils Relative: 0 %
Eosinophils Absolute: 0 10*3/uL (ref 0.0–0.5)
Eosinophils Relative: 0 %
HCT: 48.2 % (ref 39.0–52.0)
Hemoglobin: 15.4 g/dL (ref 13.0–17.0)
Immature Granulocytes: 1 %
Lymphocytes Relative: 14 %
Lymphs Abs: 1.1 10*3/uL (ref 0.7–4.0)
MCH: 30.6 pg (ref 26.0–34.0)
MCHC: 32 g/dL (ref 30.0–36.0)
MCV: 95.6 fL (ref 80.0–100.0)
Monocytes Absolute: 0.6 10*3/uL (ref 0.1–1.0)
Monocytes Relative: 7 %
Neutro Abs: 6 10*3/uL (ref 1.7–7.7)
Neutrophils Relative %: 78 %
Platelets: 246 10*3/uL (ref 150–400)
RBC: 5.04 MIL/uL (ref 4.22–5.81)
RDW: 13.3 % (ref 11.5–15.5)
WBC: 7.7 10*3/uL (ref 4.0–10.5)
nRBC: 0 % (ref 0.0–0.2)

## 2019-11-25 LAB — COMPREHENSIVE METABOLIC PANEL
ALT: 12 U/L (ref 0–44)
AST: 28 U/L (ref 15–41)
Albumin: 3 g/dL — ABNORMAL LOW (ref 3.5–5.0)
Alkaline Phosphatase: 38 U/L (ref 38–126)
Anion gap: 12 (ref 5–15)
BUN: 50 mg/dL — ABNORMAL HIGH (ref 8–23)
CO2: 25 mmol/L (ref 22–32)
Calcium: 9.1 mg/dL (ref 8.9–10.3)
Chloride: 101 mmol/L (ref 98–111)
Creatinine, Ser: 1.55 mg/dL — ABNORMAL HIGH (ref 0.61–1.24)
GFR calc Af Amer: 54 mL/min — ABNORMAL LOW (ref >60)
GFR calc non Af Amer: 47 mL/min — ABNORMAL LOW (ref >60)
Glucose, Bld: 195 mg/dL — ABNORMAL HIGH (ref 70–99)
Potassium: 4.7 mmol/L (ref 3.5–5.1)
Sodium: 138 mmol/L (ref 135–145)
Total Bilirubin: 0.6 mg/dL (ref 0.3–1.2)
Total Protein: 6.9 g/dL (ref 6.5–8.1)

## 2019-11-25 LAB — MAGNESIUM: Magnesium: 2.8 mg/dL — ABNORMAL HIGH (ref 1.7–2.4)

## 2019-11-25 LAB — D-DIMER, QUANTITATIVE: D-Dimer, Quant: 0.47 ug/mL-FEU (ref 0.00–0.50)

## 2019-11-25 LAB — FERRITIN: Ferritin: 1391 ng/mL — ABNORMAL HIGH (ref 24–336)

## 2019-11-25 LAB — C-REACTIVE PROTEIN: CRP: 6.5 mg/dL — ABNORMAL HIGH (ref <1.0)

## 2019-11-25 NOTE — Plan of Care (Signed)

## 2019-11-25 NOTE — Progress Notes (Signed)
PROGRESS NOTE    Jeffery Munoz  XNA:[id number] DOB: 1955/12/20 DOA: 11/23/2019 PCP: Oval Linsey, MD    Brief Narrative:  Jeffery Munoz  is a 64 y.o. male, with history of coronary artery disease, hyperlipidemia, essential hypertension, CHF, and more presents to the ED with a chief complaint of dyspnea.  Patient reports that he presented to urgent care for difficulty breathing.  He does not know when the symptoms start but after asking a question several different ways he said maybe 2 days ago.  He reports that at the urgent care they told him that he had an EKG change and sent him to the ER.  Patient reports that with this dyspnea he has had a cough.  Productive of white sputum, but no blood.  Patient reports a decreased appetite.  His smell and taste are intact.  He has had no nausea vomiting, abdominal pain, body aches, or fever.  He is not sure if he was vaccinated or not.  Patient is not on oxygen at baseline.  Overall patient is a poor historian, which could be due to the late hour, and waking him from sleep.  He reports that at this time he does not feel short of breath with the oxygen on.  He is not coughing when I am in the room.  Patient is a former smoker, and reports that he quit smoking 2 weeks ago.  Patient has no other complaints at this time.  In the ED Temperature 99, heart rate 73, respiratory rate between 19 and 33, blood pressure 125/85, satting at 97% on 6 L nasal cannula No leukocytosis with a white blood cell count of 7.6 AKI with a creatinine of 1.79 baseline is 1.2-1.3 EKG shows a heart rate of 73, sinus rhythm, QTC 427, diffuse T wave inversions including lead I lead to lead III, aVF, V1, V2 V3 and V6 Inflammatory markers drawn but pending Decadron remdesivir and albuterol given in the ED BNP 93   Assessment & Plan:   Active Problems:   Essential hypertension, benign   Pneumonia due to COVID-19 virus   Acute respiratory failure due to COVID-19 [facility])    AKI (acute kidney injury) (HCC)   Acute respiratory failure with hypoxia secondary to COVID-19 pneumonia. -Currently on 4-5 L of oxygen, continue to wean down as tolerated -Remdesivir day 3 of 5 -Steroids day 3 -Continue bronchodilators and as needed antitussive medications -Continue to follow inflammatory markers.  Essential hypertension -Vital signs stable -Continue heart healthy diet -No using any antihypertensive agents prior to admission.  Acute kidney injury -Likely secondary to dehydration -Continue IV fluids and follow-up renal function -Creatinine trending down -Advised to maintain adequate hydration.  CAD s/p stenting in 2002 -no chest pain at this time -Shortness of breath secondary to acute Covid infection -Will heart healthy diet -Patient no using any medications prior to admission. -At discharge will recommend baby aspirin on daily basis.  DVT prophylaxis: heparin injection 5,000 Units Start: 11/23/19 2330 SCDs Start: 11/23/19 2328  Code Status: Full code Family Communication: No family at bedside.  After discussing patient plan of care he will update family on his own. Disposition Plan: Status is: Inpatient  Remains inpatient appropriate because:Inpatient level of care appropriate due to severity of illness   Dispo: The patient is from: Home              Anticipated d/c is to: Home              Anticipated d/c  date is: 1 day              Patient currently is not medically stable to d/c.  Requiring 4 L nasal cannula supplementation, short winded with activity and experience intermittent coughing spells.   Consultants:   None  Procedures:   See below for x-ray reports  Antimicrobials:   Actively receiving remdesivir.   Subjective: Complaining of shortness of breath, intermittent coughing spells reported.  No chest pain, no nausea, no vomiting.  Objective: Vitals:   11/25/19 1047 11/25/19 1124 11/25/19 1125 11/25/19 1126  BP: (!) 148/74  136/85    Pulse:   65 65  Resp: 18     Temp:      TempSrc:      SpO2: 98%  94% 95%   No intake or output data in the 24 hours ending 11/25/19 1514 There were no vitals filed for this visit.  Examination: General exam: Alert, awake, oriented x 3, reports feeling short of breath with activity and experiencing intermittent coughing spells.  Requiring 4 L nasal supplementation at this time.  Patient is afebrile.  No chest pain. Respiratory system: Positive scattered rhonchi, no wheezing; mild tachypnea with activity.  No using accessory muscle. Cardiovascular system:RRR. No murmurs, rubs, gallops. Gastrointestinal system: Abdomen is nondistended, soft and nontender. No organomegaly or masses felt. Normal bowel sounds heard. Central nervous system: Alert and oriented. No focal neurological deficits. Extremities: No cyanosis or clubbing. Skin: No rashes, no petechiae. Psychiatry: Judgement and insight appear normal. Mood & affect appropriate.    Data Reviewed: I have personally reviewed following labs and imaging studies  CBC: Recent Labs  Lab 11/23/19 1821 11/24/19 0625 11/25/19 0459  WBC 7.6 6.5 7.7  NEUTROABS 5.6 5.3 6.0  HGB 15.2 15.2 15.4  HCT 47.4 48.5 48.2  MCV 94.6 97.4 95.6  PLT 190 219 246   Basic Metabolic Panel: Recent Labs  Lab 11/23/19 1821 11/24/19 0625 11/25/19 0459  NA 138 140 138  K 3.6 4.6 4.7  CL 103 103 101  CO2 20* 26 25  GLUCOSE 118* 170* 195*  BUN 33* 40* 50*  CREATININE 1.79* 1.73* 1.55*  CALCIUM 8.4* 8.6* 9.1  MG  --  2.6* 2.8*   GFR: CrCl cannot be calculated (Unknown ideal weight.).   Liver Function Tests: Recent Labs  Lab 11/23/19 1821 11/24/19 0625 11/25/19 0459  AST 31 28 28   ALT 11 10 12   ALKPHOS 39 38 38  BILITOT 0.7 0.5 0.6  PROT 7.3 7.0 6.9  ALBUMIN 3.4* 3.0* 3.0*   Lipid Profile: Recent Labs    11/23/19 1821  TRIG 150*   Anemia Panel: Recent Labs    11/24/19 0625 11/25/19 0459  FERRITIN 1,177* 1,391*    Sepsis Labs: Recent Labs  Lab 11/23/19 1821 11/23/19 2145  PROCALCITON 0.48  --   LATICACIDVEN 1.3 1.3    Recent Results (from the past 240 hour(s))  SARS Coronavirus 2 by RT PCR (hospital order, performed in [facility] hospital lab) Nasopharyngeal Nasopharyngeal Swab     Status: Abnormal   Collection Time: 11/23/19  4:34 PM   Specimen: Nasopharyngeal Swab  Result Value Ref Range Status   SARS Coronavirus 2 POSITIVE (A) NEGATIVE Final    Comment: RESULT CALLED TO, READ BACK BY AND VERIFIED WITH: BETHEL,S ON 11/23/19 AT 1820 BY LOY,C (NOTE) SARS-CoV-2 target nucleic acids are DETECTED  SARS-CoV-2 RNA is generally detectable in upper respiratory specimens  during the acute phase of infection.  Positive results are  indicative  of the presence of the identified virus, but do not rule out bacterial infection or co-infection with other pathogens not detected by the test.  Clinical correlation with patient history and  other diagnostic information is necessary to determine patient infection status.  The expected result is negative.  Fact Sheet for Patients:   BoilerBrush.com.cy   Fact Sheet for Healthcare Providers:   https://pope.com/    This test is not yet approved or cleared by the [place] FDA and  has been authorized for detection and/or diagnosis of SARS-CoV-2 by FDA under an Emergency Use Authorization (EUA).  This EUA will remain in effect (meaning this  test can be used) for the duration of  the COVID-19 declaration under Section 564(b)(1) of the Act, 21 U.S.C. section 360-bbb-3(b)(1), unless the authorization is terminated or revoked sooner.  Performed at [facility], [address] [place], [place] [address]   Blood Culture (routine x 2)     Status: None (Preliminary result)   Collection Time: 11/23/19  6:21 PM   Specimen: Right Antecubital; Blood  Result Value Ref Range Status   Specimen Description RIGHT  ANTECUBITAL  Final   Special Requests   Final    BOTTLES DRAWN AEROBIC AND ANAEROBIC Blood Culture adequate volume   Culture   Final    NO GROWTH 2 DAYS Performed at [facility], [address] [place], [place] [address]    Report Status PENDING  Incomplete  Blood Culture (routine x 2)     Status: None (Preliminary result)   Collection Time: 11/23/19  6:21 PM   Specimen: Right Antecubital; Blood  Result Value Ref Range Status   Specimen Description RIGHT ANTECUBITAL  Final   Special Requests   Final    BOTTLES DRAWN AEROBIC AND ANAEROBIC Blood Culture adequate volume   Culture   Final    NO GROWTH 2 DAYS Performed at [facility], [address] [place], [place] [address]    Report Status PENDING  Incomplete     Radiology Studies: DG Chest Portable 1 View  Result Date: 11/23/2019 CLINICAL DATA:  Weakness and cough. EXAM: PORTABLE CHEST 1 VIEW COMPARISON:  Jul 28, 2017 FINDINGS: Mild areas of atelectasis and/or infiltrate are seen within the bilateral lung bases. There is no evidence of a pleural effusion or pneumothorax. The heart size and mediastinal contours are within normal limits. The visualized skeletal structures are unremarkable. IMPRESSION: Mild bibasilar atelectasis and/or infiltrate. Electronically Signed   By: Aram Candela M.D.   On: 11/23/2019 18:05    Scheduled Meds: . vitamin C  500 mg Oral Daily  . dexamethasone (DECADRON) injection  6 mg Intravenous Q24H  . heparin  5,000 Units Subcutaneous Q8H  . zinc sulfate  220 mg Oral Daily   Continuous Infusions: . remdesivir 100 mg in NS 100 mL Stopped (11/25/19 1138)     LOS: 2 days    Time spent: 35 mins    Vassie Loll, MD [facility]   If 7PM-7AM, please contact night-coverage [email or web address]  11/25/2019, 3:14 PM

## 2019-11-25 NOTE — ED Notes (Signed)
Assumed care of pt at 0700 

## 2019-11-26 DIAGNOSIS — J9601 Acute respiratory failure with hypoxia: Secondary | ICD-10-CM

## 2019-11-26 DIAGNOSIS — Z720 Tobacco use: Secondary | ICD-10-CM

## 2019-11-26 LAB — COMPREHENSIVE METABOLIC PANEL
ALT: 14 U/L (ref 0–44)
AST: 29 U/L (ref 15–41)
Albumin: 3.5 g/dL (ref 3.5–5.0)
Alkaline Phosphatase: 42 U/L (ref 38–126)
Anion gap: 10 (ref 5–15)
BUN: 45 mg/dL — ABNORMAL HIGH (ref 8–23)
CO2: 27 mmol/L (ref 22–32)
Calcium: 9.3 mg/dL (ref 8.9–10.3)
Chloride: 104 mmol/L (ref 98–111)
Creatinine, Ser: 1.34 mg/dL — ABNORMAL HIGH (ref 0.61–1.24)
GFR calc Af Amer: >60 mL/min (ref >60)
GFR calc non Af Amer: 56 mL/min — ABNORMAL LOW (ref >60)
Glucose, Bld: 170 mg/dL — ABNORMAL HIGH (ref 70–99)
Potassium: 4 mmol/L (ref 3.5–5.1)
Sodium: 141 mmol/L (ref 135–145)
Total Bilirubin: 0.5 mg/dL (ref 0.3–1.2)
Total Protein: 7.5 g/dL (ref 6.5–8.1)

## 2019-11-26 LAB — CBC WITH DIFFERENTIAL/PLATELET
Abs Immature Granulocytes: 0.06 10*3/uL (ref 0.00–0.07)
Basophils Absolute: 0 10*3/uL (ref 0.0–0.1)
Basophils Relative: 0 %
Eosinophils Absolute: 0 10*3/uL (ref 0.0–0.5)
Eosinophils Relative: 0 %
HCT: 51.3 % (ref 39.0–52.0)
Hemoglobin: 16.6 g/dL (ref 13.0–17.0)
Immature Granulocytes: 1 %
Lymphocytes Relative: 13 %
Lymphs Abs: 1.3 10*3/uL (ref 0.7–4.0)
MCH: 30.8 pg (ref 26.0–34.0)
MCHC: 32.4 g/dL (ref 30.0–36.0)
MCV: 95.2 fL (ref 80.0–100.0)
Monocytes Absolute: 0.9 10*3/uL (ref 0.1–1.0)
Monocytes Relative: 9 %
Neutro Abs: 8.1 10*3/uL — ABNORMAL HIGH (ref 1.7–7.7)
Neutrophils Relative %: 77 %
Platelets: 317 10*3/uL (ref 150–400)
RBC: 5.39 MIL/uL (ref 4.22–5.81)
RDW: 13.1 % (ref 11.5–15.5)
WBC: 10.4 10*3/uL (ref 4.0–10.5)
nRBC: 0 % (ref 0.0–0.2)

## 2019-11-26 LAB — FERRITIN: Ferritin: 979 ng/mL — ABNORMAL HIGH (ref 24–336)

## 2019-11-26 LAB — D-DIMER, QUANTITATIVE: D-Dimer, Quant: 0.51 ug/mL-FEU — ABNORMAL HIGH (ref 0.00–0.50)

## 2019-11-26 LAB — MAGNESIUM: Magnesium: 2.7 mg/dL — ABNORMAL HIGH (ref 1.7–2.4)

## 2019-11-26 LAB — C-REACTIVE PROTEIN: CRP: 3.4 mg/dL — ABNORMAL HIGH (ref <1.0)

## 2019-11-26 MED ORDER — DEXAMETHASONE 6 MG PO TABS: 6.0000 mg | ORAL_TABLET | Freq: Every day | ORAL | 0 refills | Status: AC

## 2019-11-26 MED ORDER — PANTOPRAZOLE SODIUM 40 MG PO TBEC: 40.0000 mg | DELAYED_RELEASE_TABLET | Freq: Every day | ORAL | 0 refills | Status: DC

## 2019-11-26 MED ORDER — GUAIFENESIN-DM 100-10 MG/5ML PO SYRP: 10.0000 mL | ORAL_SOLUTION | Freq: Three times a day (TID) | ORAL | 0 refills | Status: DC | PRN

## 2019-11-26 MED ORDER — SODIUM CHLORIDE 0.9 % IV SOLN
INTRAVENOUS | Status: DC | PRN
  Administered 2019-11-26: 250 mL via INTRAVENOUS

## 2019-11-26 MED ORDER — AMLODIPINE BESYLATE 5 MG PO TABS
5.0000 mg | ORAL_TABLET | Freq: Every day | ORAL | 1 refills | Status: DC
Start: 2019-11-27 — End: 2022-11-29

## 2019-11-26 MED ORDER — ZINC SULFATE 220 (50 ZN) MG PO CAPS: 220.0000 mg | ORAL_CAPSULE | Freq: Every day | ORAL | 1 refills | Status: DC

## 2019-11-26 MED ORDER — IPRATROPIUM-ALBUTEROL 20-100 MCG/ACT IN AERS: 1.0000 | INHALATION_SPRAY | Freq: Four times a day (QID) | RESPIRATORY_TRACT | 1 refills | Status: DC | PRN

## 2019-11-26 MED ORDER — AMLODIPINE BESYLATE 5 MG PO TABS
5.0000 mg | ORAL_TABLET | Freq: Every day | ORAL | Status: DC
  Administered 2019-11-26: 5 mg via ORAL
  Filled 2019-11-26: qty 1

## 2019-11-26 MED ORDER — ASCORBIC ACID 500 MG PO TABS
500.0000 mg | ORAL_TABLET | Freq: Every day | ORAL | 1 refills | Status: DC
Start: 2019-11-27 — End: 2022-11-29

## 2019-11-26 NOTE — Discharge Instructions (Signed)
Patient scheduled for outpatient Remdesivir infusions at 2pm on Friday 9/24 and Saturday 9/25 at [facility]. Please inform the patient to park at [address] [place], [place], as staff will be escorting the patient through the east entrance of the hospital. Appointments take approximately 45 minutes.    There is a wave flag banner located near the entrance on [address]. Turn into this entrance and immediately turn left and park in 1 of the 5 designated Covid Infusion Parking spots. There is a phone number on the sign, please call and let the staff know what spot you are in and we will come out and get you. For questions call [phone].  Thanks.

## 2019-11-26 NOTE — Plan of Care (Signed)

## 2019-11-26 NOTE — Progress Notes (Signed)
Patient scheduled for outpatient Remdesivir infusions at 2pm on Friday 9/24 and Saturday 9/25 at [facility]. Please inform the patient to park at [address], [place], as staff will be escorting the patient through the east entrance of the hospital. Appointments take approximately 45 minutes.    There is a wave flag banner located near the entrance on [address] Turn into this entrance and immediately turn left and park in 1 of the 5 designated Covid Infusion Parking spots. There is a phone number on the sign, please call and let the staff know what spot you are in and we will come out and get you. For questions call [phone].  Thanks. 

## 2019-11-26 NOTE — Discharge Summary (Signed)
Physician Discharge Summary  Jeffery RowerClarence E Minogue DGU:[id number] DOB: June 07, 1955 DOA: 11/23/2019  PCP: Oval Linseyondiego, Richard, MD  Admit date: 11/23/2019 Discharge date: 11/26/2019  Time spent: 35 minutes  Recommendations for Outpatient Follow-up:  1. Repeat BMET to follow electrolytes and renal function 2. Reassess BP and adjust antihypertensive regimen as needed 3. Initiate treatment with baby aspirin and further evaluate stability of his CAD risk factors to determine the need of further medications (like checking lipid panel and decide on statins use). 4. assist patient with smoking cessation.   Discharge Diagnoses:  Active Problems:   Essential hypertension, benign   Pneumonia due to COVID-19 virus   COVID-19   AKI (acute kidney injury) (HCC)   Acute respiratory failure with hypoxia (HCC)   Tobacco abuse   Discharge Condition: stable and improved. Not requiring oxygen supplementation. 2 doses of remdesivir as an outpatient arranged. Follow up with PCP in 10 days emphasized.  Code status: full code  Diet recommendation: heart healthy diet  History of present illness:  ClarenceBrooksis a63 y.o.male,with history of coronary artery disease, hyperlipidemia, essential hypertension, CHF, and more presents to the ED with a chief complaint of dyspnea. Patient reports that he presented to urgent care for difficulty breathing. He does not know when the symptoms start but after asking a question several different ways he said maybe 2 days ago. He reports that at the urgent care they told him that he had an EKG change and sent him to the ER. Patient reports that with this dyspnea he has had a cough. Productive of white sputum, but no blood. Patient reports a decreased appetite. His smell and taste are intact. He has had no nausea vomiting, abdominal pain, body aches, or fever. He is not sure if he was vaccinated or not. Patient is not on oxygen at baseline. Overall patient is a poor  historian, which could be due to the late hour, and waking him from sleep. He reports that at this time he does not feel short of breath with the oxygen on. He is not coughing when I am in the room. Patient is a former smoker, and reports that he quit smoking 2 weeks ago. Patient has no other complaints at this time.  In the ED Temperature 99, heart rate 73, respiratory rate between 19 and 33, blood pressure 125/85, satting at 97% on 6 L nasal cannula No leukocytosis with a white blood cell count of 7.6 AKI with a creatinine of 1.79 baseline is 1.2-1.3 EKG shows a heart rate of 73, sinus rhythm, QTC 427, diffuse T wave inversions including lead I lead to lead III, aVF, V1, V2 V3 and V6 Inflammatory markers drawn but pending Decadron remdesivir and albuterol given in the ED BNP 93  Hospital Course:  Acute respiratory failure with hypoxia secondary to COVID-19 pneumonia. -Currently on 4-5 L of oxygen, continue to wean down as tolerated -Remdesivir day 3 completed while inpatient; 2 more days of infusion arranged as an outpatient to complete therapy. -decadron orally to complete 10 days of treatment. -Continue bronchodilators and as needed antitussive medications -advise to maintain adequate hydraiton  Essential hypertension -Vital signs stable at discharge -advise to follow heart healthy diet -started on Norvasc 5 mg daily -follow up with PCP to reassess BP and further adjust antihypertensive regimen   Acute kidney injury -Likely secondary to dehydration -Cr 1.3 at discharge after IVF's -instructed to avoid the use of nephrotoxic agents at discharge and to maintain adequate hydration. -repeat BMET at follow up visit.  CAD s/p stenting in 2002 -no chest pain at this time -Shortness of breath secondary to acute Covid infection -encouraged to follow heart healthy diet -Patient no using any medications prior to admission. -will recommend baby aspirin on daily basis after  complete resolution of renal function   Procedures:  See below for x-ray reports   Consultations:  None   Discharge Exam: Vitals:   11/25/19 2000 11/26/19 0353  BP: (!) 135/101 (!) 143/102  Pulse: 67 (!) 59  Resp: 17 17  Temp: 98.4 F (36.9 C) (!) 97.5 F (36.4 C)  SpO2: 92% 94%    General: afebrile, no CP, no nausea, no vomiting. Reports improvement in his breathing and currently with good O2 sat on RA. Cardiovascular: S1 and S2 appreacited, no rubs, no gallops, no JVD Respiratory: positive scattered rhonchi, no wheezing, no crackles. Abdomen: soft, NT, ND positive BS Extremities: no edema, no cyanosis, no clubbing   Discharge Instructions:   Discharge Instructions    Diet - low sodium heart healthy   Complete by: As directed    Discharge instructions   Complete by: As directed    Stop smoking Maintain adequate hydration Take medications as prescribed Follow heart healthy diet Please arrange follow up with PCP in 10 days.   Increase activity slowly   Complete by: As directed      Allergies as of 11/26/2019   No Known Allergies     Medication List    TAKE these medications   amLODipine 5 MG tablet Commonly known as: NORVASC Take 1 tablet (5 mg total) by mouth daily. Start taking on: November 27, 2019   ascorbic acid 500 MG tablet Commonly known as: VITAMIN C Take 1 tablet (500 mg total) by mouth daily. Start taking on: November 27, 2019   dexamethasone 6 MG tablet Commonly known as: DECADRON Take 1 tablet (6 mg total) by mouth daily for 10 days.   guaiFENesin-dextromethorphan 100-10 MG/5ML syrup Commonly known as: ROBITUSSIN DM Take 10 mLs by mouth every 8 (eight) hours as needed for cough.   Ipratropium-Albuterol 20-100 MCG/ACT Aers respimat Commonly known as: COMBIVENT Inhale 1 puff into the lungs every 6 (six) hours as needed for wheezing or shortness of breath.   pantoprazole 40 MG tablet Commonly known as: Protonix Take 1 tablet (40 mg  total) by mouth daily.   zinc sulfate 220 (50 Zn) MG capsule Take 1 capsule (220 mg total) by mouth daily. Start taking on: November 27, 2019      No Known Allergies  Follow-up Information    Oval Linsey, MD. Schedule an appointment as soon as possible for a visit in 10 day(s).   Specialty: Internal Medicine Contact information: [address] [place] [address] [phone]               The results of significant diagnostics from this hospitalization (including imaging, microbiology, ancillary and laboratory) are listed below for reference.    Significant Diagnostic Studies: DG Chest Portable 1 View  Result Date: 11/23/2019 CLINICAL DATA:  Weakness and cough. EXAM: PORTABLE CHEST 1 VIEW COMPARISON:  Jul 28, 2017 FINDINGS: Mild areas of atelectasis and/or infiltrate are seen within the bilateral lung bases. There is no evidence of a pleural effusion or pneumothorax. The heart size and mediastinal contours are within normal limits. The visualized skeletal structures are unremarkable. IMPRESSION: Mild bibasilar atelectasis and/or infiltrate. Electronically Signed   By: Aram Candela M.D.   On: 11/23/2019 18:05    Microbiology: Recent Results (  from the past 240 hour(s))  SARS Coronavirus 2 by RT PCR (hospital order, performed in [facility] hospital lab) Nasopharyngeal Nasopharyngeal Swab     Status: Abnormal   Collection Time: 11/23/19  4:34 PM   Specimen: Nasopharyngeal Swab  Result Value Ref Range Status   SARS Coronavirus 2 POSITIVE (A) NEGATIVE Final    Comment: RESULT CALLED TO, READ BACK BY AND VERIFIED WITH: BETHEL,S ON 11/23/19 AT 1820 BY LOY,C (NOTE) SARS-CoV-2 target nucleic acids are DETECTED  SARS-CoV-2 RNA is generally detectable in upper respiratory specimens  during the acute phase of infection.  Positive results are indicative  of the presence of the identified virus, but do not rule out bacterial infection or co-infection with other  pathogens not detected by the test.  Clinical correlation with patient history and  other diagnostic information is necessary to determine patient infection status.  The expected result is negative.  Fact Sheet for Patients:   BoilerBrush.com.cy   Fact Sheet for Healthcare Providers:   https://pope.com/    This test is not yet approved or cleared by the [place] FDA and  has been authorized for detection and/or diagnosis of SARS-CoV-2 by FDA under an Emergency Use Authorization (EUA).  This EUA will remain in effect (meaning this  test can be used) for the duration of  the COVID-19 declaration under Section 564(b)(1) of the Act, 21 U.S.C. section 360-bbb-3(b)(1), unless the authorization is terminated or revoked sooner.  Performed at [facility], [address] [place], [place] [address]   Blood Culture (routine x 2)     Status: None (Preliminary result)   Collection Time: 11/23/19  6:21 PM   Specimen: Right Antecubital; Blood  Result Value Ref Range Status   Specimen Description RIGHT ANTECUBITAL  Final   Special Requests   Final    BOTTLES DRAWN AEROBIC AND ANAEROBIC Blood Culture adequate volume   Culture   Final    NO GROWTH 3 DAYS Performed at [facility], [address] [place], [place] [address]    Report Status PENDING  Incomplete  Blood Culture (routine x 2)     Status: None (Preliminary result)   Collection Time: 11/23/19  6:21 PM   Specimen: Right Antecubital; Blood  Result Value Ref Range Status   Specimen Description RIGHT ANTECUBITAL  Final   Special Requests   Final    BOTTLES DRAWN AEROBIC AND ANAEROBIC Blood Culture adequate volume   Culture   Final    NO GROWTH 3 DAYS Performed at [facility], [address] [place], [place] [address]    Report Status PENDING  Incomplete     Labs: Basic Metabolic Panel: Recent Labs  Lab 11/23/19 1821 11/24/19 0625 11/25/19 0459 11/26/19 0559  NA 138 140 138  141  K 3.6 4.6 4.7 4.0  CL 103 103 101 104  CO2 20* 26 25 27   GLUCOSE 118* 170* 195* 170*  BUN 33* 40* 50* 45*  CREATININE 1.79* 1.73* 1.55* 1.34*  CALCIUM 8.4* 8.6* 9.1 9.3  MG  --  2.6* 2.8* 2.7*   Liver Function Tests: Recent Labs  Lab 11/23/19 1821 11/24/19 0625 11/25/19 0459 11/26/19 0559  AST 31 28 28 29   ALT 11 10 12 14   ALKPHOS 39 38 38 42  BILITOT 0.7 0.5 0.6 0.5  PROT 7.3 7.0 6.9 7.5  ALBUMIN 3.4* 3.0* 3.0* 3.5   CBC: Recent Labs  Lab 11/23/19 1821 11/24/19 0625 11/25/19 0459 11/26/19 0559  WBC 7.6 6.5 7.7 10.4  NEUTROABS 5.6 5.3 6.0 8.1*  HGB 15.2 15.2 15.4 16.6  HCT 47.4 48.5 48.2 51.3  MCV 94.6 97.4 95.6 95.2  PLT 190 219 246 317   BNP (last 3 results) Recent Labs    11/23/19 1821  BNP 93.0    Signed:  Vassie Loll MD.  [facility] 11/26/2019, 2:27 PM

## 2019-11-26 NOTE — Progress Notes (Signed)
Nsg Discharge Note  Admit Date:  11/23/2019 Discharge date: 11/26/2019   JUDGE DUQUE to be D/C'd Home  per MD order.  AVS completed. Patient able to verbalize understanding.  Discharge Medication: Allergies as of 11/26/2019   No Known Allergies     Medication List    TAKE these medications   amLODipine 5 MG tablet Commonly known as: NORVASC Take 1 tablet (5 mg total) by mouth daily. Start taking on: November 27, 2019   ascorbic acid 500 MG tablet Commonly known as: VITAMIN C Take 1 tablet (500 mg total) by mouth daily. Start taking on: November 27, 2019   dexamethasone 6 MG tablet Commonly known as: DECADRON Take 1 tablet (6 mg total) by mouth daily for 10 days.   guaiFENesin-dextromethorphan 100-10 MG/5ML syrup Commonly known as: ROBITUSSIN DM Take 10 mLs by mouth every 8 (eight) hours as needed for cough.   Ipratropium-Albuterol 20-100 MCG/ACT Aers respimat Commonly known as: COMBIVENT Inhale 1 puff into the lungs every 6 (six) hours as needed for wheezing or shortness of breath.   pantoprazole 40 MG tablet Commonly known as: Protonix Take 1 tablet (40 mg total) by mouth daily.   zinc sulfate 220 (50 Zn) MG capsule Take 1 capsule (220 mg total) by mouth daily. Start taking on: November 27, 2019       Discharge Assessment: Vitals:   11/25/19 2000 11/26/19 0353  BP: (!) 135/101 (!) 143/102  Pulse: 67 (!) 59  Resp: 17 17  Temp: 98.4 F (36.9 C) (!) 97.5 F (36.4 C)  SpO2: 92% 94%   Skin clean, dry and intact without evidence of skin break down, no evidence of skin tears noted. IV catheter discontinued intact. Site without signs and symptoms of complications - no redness or edema noted at insertion site, patient denies c/o pain - only slight tenderness at site.  Dressing with slight pressure applied.  D/c Instructions-Education: Discharge instructions given to patient/family with verbalized understanding. D/c education completed with patient  including follow up instructions, medication list, d/c activities limitations if indicated, with other d/c instructions as indicated by MD - patient able to verbalize understanding, all questions fully answered. Patient instructed to return to ED, call 911, or call MD for any changes in condition.  Patient escorted via WC, and D/C home via private auto.  Jethro Poling, RN 11/26/2019 3:11 PM

## 2019-11-26 NOTE — Progress Notes (Signed)
SATURATION QUALIFICATIONS: (This note is used to comply with regulatory documentation for home oxygen)  Patient Saturations on Room Air at Rest = 97 %  Patient Saturations on Room Air while Ambulating = 98 %   Patient tolerated ambulation very well. No evidence of respiratory distress during ambulation. Patient denied any discomfort or distress during ambulation.

## 2019-11-27 ENCOUNTER — Ambulatory Visit (HOSPITAL_COMMUNITY): Payer: Medicaid Other | Attending: Pulmonary Disease

## 2019-11-28 ENCOUNTER — Ambulatory Visit (HOSPITAL_COMMUNITY)
Admit: 2019-11-28 | Discharge: 2019-11-28 | Disposition: A | Discharge status: Home / Self Care | Payer: Medicaid Other | Attending: Pulmonary Disease | Admitting: Pulmonary Disease

## 2019-11-28 DIAGNOSIS — U071 COVID-19: Secondary | ICD-10-CM | POA: Diagnosis not present

## 2019-11-28 LAB — CULTURE, BLOOD (ROUTINE X 2)
Culture: NO GROWTH
Culture: NO GROWTH
Special Requests: ADEQUATE
Special Requests: ADEQUATE

## 2019-11-28 MED ORDER — ALBUTEROL SULFATE HFA 108 (90 BASE) MCG/ACT IN AERS: 2.0000 | INHALATION_SPRAY | Freq: Once | RESPIRATORY_TRACT | Status: DC | PRN

## 2019-11-28 MED ORDER — EPINEPHRINE 0.3 MG/0.3ML IJ SOAJ: 0.3000 mg | Freq: Once | INTRAMUSCULAR | Status: DC | PRN

## 2019-11-28 MED ORDER — FAMOTIDINE IN NACL 20-0.9 MG/50ML-% IV SOLN: 20.0000 mg | Freq: Once | INTRAVENOUS | Status: DC | PRN

## 2019-11-28 MED ORDER — SODIUM CHLORIDE 0.9 % IV SOLN: INTRAVENOUS | Status: DC | PRN

## 2019-11-28 MED ORDER — DIPHENHYDRAMINE HCL 50 MG/ML IJ SOLN: 50.0000 mg | Freq: Once | INTRAMUSCULAR | Status: DC | PRN

## 2019-11-28 MED ORDER — METHYLPREDNISOLONE SODIUM SUCC 125 MG IJ SOLR: 125.0000 mg | Freq: Once | INTRAMUSCULAR | Status: DC | PRN

## 2019-11-28 MED ORDER — SODIUM CHLORIDE 0.9 % IV SOLN
100.0000 mg | Freq: Once | INTRAVENOUS | Status: AC
  Administered 2019-11-28: 100 mg via INTRAVENOUS
  Filled 2019-11-28: qty 20

## 2019-11-28 NOTE — Discharge Instructions (Signed)
10 Things You Can Do to Manage Your COVID-19 Symptoms at Home If you have possible or confirmed COVID-19: 1. Stay home from work and school. And stay away from other public places. If you must go out, avoid using any kind of public transportation, ridesharing, or taxis. 2. Monitor your symptoms carefully. If your symptoms get worse, call your healthcare provider immediately. 3. Get rest and stay hydrated. 4. If you have a medical appointment, call the healthcare provider ahead of time and tell them that you have or may have COVID-19. 5. For medical emergencies, call 911 and notify the dispatch personnel that you have or may have COVID-19. 6. Cover your cough and sneezes with a tissue or use the inside of your elbow. 7. Wash your hands often with soap and water for at least 20 seconds or clean your hands with an alcohol-based hand sanitizer that contains at least 60% alcohol. 8. As much as possible, stay in a specific room and away from other people in your home. Also, you should use a separate bathroom, if available. If you need to be around other people in or outside of the home, wear a mask. 9. Avoid sharing personal items with other people in your household, like dishes, towels, and bedding. 10. Clean all surfaces that are touched often, like counters, tabletops, and doorknobs. Use household cleaning sprays or wipes according to the label instructions. cdc.gov/coronavirus 09/03/2018 This information is not intended to replace advice given to you by your health care provider. Make sure you discuss any questions you have with your health care provider. Document Revised: 02/05/2019 Document Reviewed: 02/05/2019 Elsevier Patient Education  2020 Elsevier Inc.  

## 2019-11-28 NOTE — Progress Notes (Addendum)
  Diagnosis: COVID-19  Physician: Dr. Delford Field  Procedure: [facility] Med: remdesivir infusion - Provided patient with remdesivir fact sheet for patients, parents and caregivers prior to infusion.  Complications: No immediate complications noted.  Discharge: Discharged home   Philis Pique 11/28/2019

## 2020-11-06 ENCOUNTER — Emergency Department (HOSPITAL_COMMUNITY)
Admission: EM | Admit: 2020-11-06 | Discharge: 2020-11-07 | Disposition: A | Discharge status: Home / Self Care | Payer: Medicare Other | Attending: Emergency Medicine | Admitting: Emergency Medicine

## 2020-11-06 ENCOUNTER — Encounter (HOSPITAL_COMMUNITY): Payer: Self-pay | Admitting: Emergency Medicine

## 2020-11-06 ENCOUNTER — Other Ambulatory Visit: Payer: Self-pay

## 2020-11-06 ENCOUNTER — Emergency Department (HOSPITAL_COMMUNITY): Payer: Medicare Other

## 2020-11-06 DIAGNOSIS — W3400XA Accidental discharge from unspecified firearms or gun, initial encounter: Secondary | ICD-10-CM | POA: Diagnosis not present

## 2020-11-06 DIAGNOSIS — Z8616 Personal history of COVID-19: Secondary | ICD-10-CM | POA: Insufficient documentation

## 2020-11-06 DIAGNOSIS — Z79899 Other long term (current) drug therapy: Secondary | ICD-10-CM | POA: Insufficient documentation

## 2020-11-06 DIAGNOSIS — J45909 Unspecified asthma, uncomplicated: Secondary | ICD-10-CM | POA: Diagnosis not present

## 2020-11-06 DIAGNOSIS — I509 Heart failure, unspecified: Secondary | ICD-10-CM | POA: Diagnosis not present

## 2020-11-06 DIAGNOSIS — F17210 Nicotine dependence, cigarettes, uncomplicated: Secondary | ICD-10-CM | POA: Insufficient documentation

## 2020-11-06 DIAGNOSIS — S91031A Puncture wound without foreign body, right ankle, initial encounter: Secondary | ICD-10-CM | POA: Insufficient documentation

## 2020-11-06 DIAGNOSIS — I2510 Atherosclerotic heart disease of native coronary artery without angina pectoris: Secondary | ICD-10-CM | POA: Diagnosis not present

## 2020-11-06 DIAGNOSIS — I110 Hypertensive heart disease with heart failure: Secondary | ICD-10-CM | POA: Insufficient documentation

## 2020-11-06 MED ORDER — HYDROCODONE-ACETAMINOPHEN 5-325 MG PO TABS
1.0000 | ORAL_TABLET | Freq: Once | ORAL | Status: AC
  Administered 2020-11-07: 1 via ORAL
  Filled 2020-11-06: qty 1
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
## 2020-11-06 NOTE — Discharge Instructions (Addendum)
Follow-up with your primary doctor for recheck of your wound.  Keep clean and dry.  Change dressings daily.  Use nonadherent gauze or petroleum gauze.  Come back if you develop swelling, redness, pus drainage or fever.
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
## 2020-11-07 DIAGNOSIS — S91031A Puncture wound without foreign body, right ankle, initial encounter: Secondary | ICD-10-CM | POA: Diagnosis not present

## 2020-11-07 MED ORDER — CEPHALEXIN 500 MG PO CAPS: 500.0000 mg | ORAL_CAPSULE | Freq: Four times a day (QID) | ORAL | 0 refills | Status: AC

## 2020-11-07 MED ORDER — HYDROCODONE-ACETAMINOPHEN 5-325 MG PO TABS: 1.0000 | ORAL_TABLET | Freq: Four times a day (QID) | ORAL | 0 refills | Status: DC | PRN

## 2020-11-07 NOTE — ED Provider Notes (Signed)
Plain films are negative.  Local wound care applied.  Patient discharged home.   Gwyneth Sprout, MD 11/07/20 [phone]

## 2020-12-01 ENCOUNTER — Ambulatory Visit
Admission: EM | Admit: 2020-12-01 | Discharge: 2020-12-01 | Disposition: A | Discharge status: Home / Self Care | Payer: Medicare Other | Attending: Family Medicine | Admitting: Family Medicine

## 2020-12-01 ENCOUNTER — Other Ambulatory Visit: Payer: Self-pay

## 2020-12-01 ENCOUNTER — Encounter: Payer: Self-pay | Admitting: Emergency Medicine

## 2020-12-01 DIAGNOSIS — L089 Local infection of the skin and subcutaneous tissue, unspecified: Secondary | ICD-10-CM | POA: Diagnosis not present

## 2020-12-01 DIAGNOSIS — W3400XA Accidental discharge from unspecified firearms or gun, initial encounter: Secondary | ICD-10-CM | POA: Diagnosis not present

## 2020-12-01 DIAGNOSIS — I10 Essential (primary) hypertension: Secondary | ICD-10-CM | POA: Diagnosis not present

## 2020-12-01 DIAGNOSIS — T148XXA Other injury of unspecified body region, initial encounter: Secondary | ICD-10-CM | POA: Diagnosis not present

## 2020-12-01 MED ORDER — CEFTRIAXONE SODIUM 1 G IJ SOLR
1.0000 g | Freq: Once | INTRAMUSCULAR | Status: AC
  Administered 2020-12-01: 1 g via INTRAMUSCULAR

## 2020-12-01 MED ORDER — DOXYCYCLINE HYCLATE 100 MG PO CAPS: 100.0000 mg | ORAL_CAPSULE | Freq: Two times a day (BID) | ORAL | 0 refills | Status: DC

## 2020-12-01 MED ORDER — HYDROCODONE-ACETAMINOPHEN 5-325 MG PO TABS: 1.0000 | ORAL_TABLET | Freq: Four times a day (QID) | ORAL | 0 refills | Status: DC | PRN

## 2020-12-01 NOTE — Discharge Instructions (Addendum)
Your blood pressure was noted to be elevated during your visit today. If you are currently taking medication for high blood pressure, please ensure you are taking this as directed. If you do not have a history of high blood pressure and your blood pressure remains persistently elevated, you may need to begin taking a medication at some point. You may return here within the next few days to recheck if unable to see your primary care provider or if you do not have a one.  BP (!) 187/98 (BP Location: Right Arm)   Pulse 64   Temp 98.7 F (37.1 C) (Oral)   Resp 18   SpO2 98%   BP Readings from Last 3 Encounters:  12/01/20 (!) 187/98  11/07/20 (!) 150/89  11/28/19 (!) 160/89   Meds ordered this encounter  Medications   cefTRIAXone (ROCEPHIN) injection 1 g   doxycycline (VIBRAMYCIN) 100 MG capsule    Sig: Take 1 capsule (100 mg total) by mouth 2 (two) times daily.    Dispense:  20 capsule    Refill:  0   HYDROcodone-acetaminophen (NORCO/VICODIN) 5-325 MG tablet    Sig: Take 1 tablet by mouth every 6 (six) hours as needed for severe pain.    Dispense:  10 tablet    Refill:  0
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
## 2020-12-12 ENCOUNTER — Ambulatory Visit (INDEPENDENT_AMBULATORY_CARE_PROVIDER_SITE_OTHER): Payer: Medicare Other | Admitting: Podiatry

## 2020-12-12 ENCOUNTER — Ambulatory Visit (INDEPENDENT_AMBULATORY_CARE_PROVIDER_SITE_OTHER): Payer: Medicare Other

## 2020-12-12 ENCOUNTER — Other Ambulatory Visit: Payer: Self-pay | Admitting: Podiatry

## 2020-12-12 ENCOUNTER — Other Ambulatory Visit: Payer: Self-pay

## 2020-12-12 DIAGNOSIS — T148XXA Other injury of unspecified body region, initial encounter: Secondary | ICD-10-CM | POA: Diagnosis not present

## 2020-12-12 DIAGNOSIS — W3400XA Accidental discharge from unspecified firearms or gun, initial encounter: Secondary | ICD-10-CM

## 2020-12-12 MED ORDER — HYDROCODONE-ACETAMINOPHEN 5-325 MG PO TABS
1.0000 | ORAL_TABLET | Freq: Four times a day (QID) | ORAL | 0 refills | Status: DC | PRN
Start: 2020-12-12 — End: 2022-11-29

## 2020-12-12 MED ORDER — GENTAMICIN SULFATE 0.1 % EX CREA: 1.0000 "application " | TOPICAL_CREAM | Freq: Two times a day (BID) | CUTANEOUS | 1 refills | Status: DC
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
## 2020-12-15 LAB — WOUND CULTURE
MICRO NUMBER:: 12483411
SPECIMEN QUALITY:: ADEQUATE

## 2020-12-15 LAB — HOUSE ACCOUNT TRACKING

## 2020-12-20 ENCOUNTER — Encounter (HOSPITAL_BASED_OUTPATIENT_CLINIC_OR_DEPARTMENT_OTHER): Payer: Medicare Other | Attending: Internal Medicine | Admitting: Internal Medicine

## 2020-12-20 ENCOUNTER — Other Ambulatory Visit: Payer: Self-pay

## 2020-12-20 DIAGNOSIS — W3400XA Accidental discharge from unspecified firearms or gun, initial encounter: Secondary | ICD-10-CM | POA: Insufficient documentation

## 2020-12-20 DIAGNOSIS — L97319 Non-pressure chronic ulcer of right ankle with unspecified severity: Secondary | ICD-10-CM | POA: Diagnosis present

## 2020-12-20 DIAGNOSIS — S91031A Puncture wound without foreign body, right ankle, initial encounter: Secondary | ICD-10-CM | POA: Diagnosis not present

## 2020-12-20 NOTE — Progress Notes (Signed)
DEMOSTHENES, VIRNIG ([id number]) Visit Report for 12/20/2020 Abuse/Suicide Risk Screen Details Patient Name: Date of Service: [name] 12/20/2020 8:15 A M Medical Record Number: [id number] Patient Account Number: [id number] Date of Birth/Sex: Treating RN: [birth year] (65 y.o. [name] Primary Care Verner Kopischke: PCP, NO Other Clinician: Referring Ly Wass: Treating Ozell Ferrera/Extender: Bary Leriche in Treatment: 0 Abuse/Suicide Risk Screen Items Answer ABUSE RISK SCREEN: Has anyone close to you tried to hurt or harm you recentlyo No Do you feel uncomfortable with anyone in your familyo No Has anyone forced you do things that you didnt want to doo No Electronic Signature(s) Signed: 12/20/2020 4:32:39 PM By: Antonieta Iba Entered By: Antonieta Iba on 12/20/2020 09:16:52 -------------------------------------------------------------------------------- Activities of Daily Living Details Patient Name: Date of Service: [name] 12/20/2020 8:15 A M Medical Record Number: [id number] Patient Account Number: [id number] Date of Birth/Sex: Treating RN: Nov 16, 1955 (65 y.o. [name] Primary Care Lilliemae Fruge: PCP, NO Other Clinician: Referring Breiana Stratmann: Treating Dequincy Born/Extender: Bary Leriche in Treatment: 0 Activities of Daily Living Items Answer Activities of Daily Living (Please select one for each item) Drive Automobile Completely Able T Medications ake Completely Able Use T elephone Completely Able Care for Appearance Completely Able Use T oilet Completely Able Bath / Shower Completely Able Dress Self Completely Able Feed Self Completely Able Walk Completely Able Get In / Out Bed Completely Able Housework Completely Able Prepare Meals Completely Able Handle Money Completely Able Shop for Self Completely Able Electronic Signature(s) Signed: 12/20/2020 4:32:39 PM By: Antonieta Iba Entered By:  Antonieta Iba on 12/20/2020 09:17:24 -------------------------------------------------------------------------------- Education Screening Details Patient Name: Date of Service: [name], [name] 12/20/2020 8:15 A M Medical Record Number: [id number] Patient Account Number: [id number] Date of Birth/Sex: Treating RN: 1956-02-03 (65 y.o. [name] Primary Care Darcie Mellone: PCP, NO Other Clinician: Referring Kinzly Pierrelouis: Treating Jann Ra/Extender: Bary Leriche in Treatment: 0 Primary Learner Assessed: Patient Learning Preferences/Education Level/Primary Language Learning Preference: Explanation, Demonstration Highest Education Level: High School Preferred Language: English Cognitive Barrier Language Barrier: No Translator Needed: No Memory Deficit: No Emotional Barrier: No Cultural/Religious Beliefs Affecting Medical Care: No Physical Barrier Impaired Vision: No Impaired Hearing: No Decreased Hand dexterity: No Knowledge/Comprehension Knowledge Level: Medium Comprehension Level: Medium Ability to understand written instructions: Medium Ability to understand verbal instructions: Medium Motivation Anxiety Level: Calm Cooperation: Cooperative Education Importance: Acknowledges Need Interest in Health Problems: Asks Questions Perception: Coherent Willingness to Engage in Self-Management Medium Activities: Readiness to Engage in Self-Management Medium Activities: Electronic Signature(s) Signed: 12/20/2020 4:32:39 PM By: Antonieta Iba Entered By: Antonieta Iba on 12/20/2020 09:17:56 -------------------------------------------------------------------------------- Fall Risk Assessment Details Patient Name: Date of Service: [name], [name] 12/20/2020 8:15 A M Medical Record Number: [id number] Patient Account Number: [id number] Date of Birth/Sex: Treating RN: [birth year] (65 y.o. [name] Primary Care Alivya Wegman: PCP, NO Other  Clinician: Referring Adetokunbo Mccadden: Treating Nel Stoneking/Extender: Bary Leriche in Treatment: 0 Fall Risk Assessment Items Have you had 2 or more falls in the last 12 monthso 0 No Have you had any fall that resulted in injury in the last 12 monthso 0 No FALLS RISK SCREEN History of falling - immediate or within 3 months 0 No Secondary diagnosis (Do you have 2 or more medical diagnoseso) 15 Yes Ambulatory aid None/bed rest/wheelchair/nurse 0 Yes Crutches/cane/walker 0 No Furniture 0 No Intravenous therapy Access/Saline/Heparin Lock 0 No Gait/Transferring Normal/ bed rest/ wheelchair 0 Yes  Weak (short steps with or without shuffle, stooped but able to lift head while walking, may seek 0 No support from furniture) Impaired (short steps with shuffle, may have difficulty arising from chair, head down, impaired 0 No balance) Mental Status Oriented to own ability 0 Yes Electronic Signature(s) Signed: 12/20/2020 4:32:39 PM By: Antonieta Iba Entered By: Antonieta Iba on 12/20/2020 09:18:13 -------------------------------------------------------------------------------- Foot Assessment Details Patient Name: Date of Service: [name], [name] 12/20/2020 8:15 A M Medical Record Number: [id number] Patient Account Number: [id number] Date of Birth/Sex: Treating RN: 1955-09-23 (65 y.o. [name] Primary Care Rigby Leonhardt: PCP, NO Other Clinician: Referring Stephine Langbehn: Treating Mark Hassey/Extender: Bary Leriche in Treatment: 0 Foot Assessment Items Site Locations + = Sensation present, - = Sensation absent, C = Callus, U = Ulcer R = Redness, W = Warmth, M = Maceration, PU = Pre-ulcerative lesion F = Fissure, S = Swelling, D = Dryness Assessment Right: Left: Other Deformity: No No Prior Foot Ulcer: No No Prior Amputation: No No Charcot Joint: No No Ambulatory Status: Ambulatory Without Help Gait: Steady Electronic Signature(s) Signed:  12/20/2020 4:32:39 PM By: Antonieta Iba Entered By: Antonieta Iba on 12/20/2020 09:23:39 -------------------------------------------------------------------------------- Nutrition Risk Screening Details Patient Name: Date of Service: [name] 12/20/2020 8:15 A M Medical Record Number: [id number] Patient Account Number: [id number] Date of Birth/Sex: Treating RN: 05-14-1955 (65 y.o. [name] Primary Care Despina Boan: PCP, NO Other Clinician: Referring Fama Muenchow: Treating Taviana Westergren/Extender: Bary Leriche in Treatment: 0 Height (in): Weight (lbs): Body Mass Index (BMI): Nutrition Risk Screening Items Score Screening NUTRITION RISK SCREEN: I have an illness or condition that made me change the kind and/or amount of food I eat 0 No I eat fewer than two meals per day 0 No I eat few fruits and vegetables, or milk products 0 No I have three or more drinks of beer, liquor or wine almost every day 0 No I have tooth or mouth problems that make it hard for me to eat 0 No I don't always have enough money to buy the food I need 0 No I eat alone most of the time 0 No I take three or more different prescribed or over-the-counter drugs a day 1 Yes Without wanting to, I have lost or gained 10 pounds in the last six months 0 No I am not always physically able to shop, cook and/or feed myself 0 No Nutrition Protocols Good Risk Protocol 0 No interventions needed Moderate Risk Protocol High Risk Proctocol Risk Level: Good Risk Score: 1 Electronic Signature(s) Signed: 12/20/2020 4:32:39 PM By: Antonieta Iba Entered By: Antonieta Iba on 12/20/2020 83:66:29
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
## 2020-12-20 NOTE — Progress Notes (Signed)
Jeffery Munoz, Jeffery Munoz ([id number]) Visit Report for 12/20/2020 Allergy List Details Patient Name: Date of Service: Jeffery Munoz 12/20/2020 8:15 A M Medical Record Number: [id number] Patient Account Number: [id number] Date of Birth/Sex: Treating RN: 1955/08/23 (65 y.o. [name] Primary Care Elva Breaker: PCP, NO Other Clinician: Referring Edon Hoadley: Treating Nadalee Neiswender/Extender: Bary Leriche in Treatment: 0 Allergies Active Allergies No Known Allergies Allergy Notes Electronic Signature(s) Signed: 12/20/2020 4:32:39 PM By: Antonieta Iba Entered By: Antonieta Iba on 12/20/2020 09:13:21 -------------------------------------------------------------------------------- Arrival Information Details Patient Name: Date of Service: Jeffery Munoz, Jeffery RENCE E. 12/20/2020 8:15 A M Medical Record Number: [id number] Patient Account Number: [id number] Date of Birth/Sex: Treating RN: 1955/09/24 (65 y.o. [name] Primary Care Sreya Froio: PCP, NO Other Clinician: Referring Velena Keegan: Treating Irean Kendricks/Extender: Bary Leriche in Treatment: 0 Visit Information Patient Arrived: Ambulatory Arrival Time: 09:11 Transfer Assistance: None Patient Identification Verified: Yes Secondary Verification Process Completed: Yes Patient Requires Transmission-Based Precautions: No Patient Has Alerts: No Electronic Signature(s) Signed: 12/20/2020 4:32:39 PM By: Antonieta Iba Entered By: Antonieta Iba on 12/20/2020 09:12:16 -------------------------------------------------------------------------------- Clinic Level of Care Assessment Details Patient Name: Date of Service: Toney Sang E. 12/20/2020 8:15 A M Medical Record Number: [id number] Patient Account Number: [id number] Date of Birth/Sex: Treating RN: 1955/08/30 (65 y.o. [name] Primary Care Fantasia Jinkins: Other Clinician: PCP, NO Referring Trinitee Horgan: Treating Aileena Iglesia/Extender:  Bary Leriche in Treatment: 0 Clinic Level of Care Assessment Items TOOL 1 Quantity Score X- 1 0 Use when EandM and Procedure is performed on INITIAL visit ASSESSMENTS - Nursing Assessment / Reassessment X- 1 20 General Physical Exam (combine w/ comprehensive assessment (listed just below) when performed on new pt. evals) X- 1 25 Comprehensive Assessment (HX, ROS, Risk Assessments, Wounds Hx, etc.) ASSESSMENTS - Wound and Skin Assessment / Reassessment []  - 0 Dermatologic / Skin Assessment (not related to wound area) ASSESSMENTS - Ostomy and/or Continence Assessment and Care []  - 0 Incontinence Assessment and Management []  - 0 Ostomy Care Assessment and Management (repouching, etc.) PROCESS - Coordination of Care []  - 0 Simple Patient / Family Education for ongoing care X- 1 20 Complex (extensive) Patient / Family Education for ongoing care X- 1 10 Staff obtains , Records, T Results / Process Orders est []  - 0 Staff telephones HHA, Nursing Homes / Clarify orders / etc []  - 0 Routine Transfer to another Facility (non-emergent condition) []  - 0 Routine Hospital Admission (non-emergent condition) []  - 0 New Admissions / / Ordering NPWT Apligraf, etc. , []  - 0 Emergency Hospital Admission (emergent condition) PROCESS - Special Needs []  - 0 Pediatric / Minor Patient Management []  - 0 Isolation Patient Management []  - 0 Hearing / Language / Visual special needs []  - 0 Assessment of Community assistance (transportation, D/C planning, etc.) []  - 0 Additional assistance / Altered mentation []  - 0 Support Surface(s) Assessment (bed, cushion, seat, etc.) INTERVENTIONS - Miscellaneous []  - 0 External ear exam []  - 0 Patient Transfer (multiple staff / / Similar devices) []  - 0 Simple Staple / Suture removal (25 or less) []  - 0 Complex Staple / Suture removal (26 or more) []  - 0 Hypo/Hyperglycemic  Management (do not check if billed separately) X- 1 15 Ankle / Brachial Index (ABI) - do not check if billed separately Has the patient been seen at the hospital within the last three years: Yes Total Score: 90 Level Of Care: New/Established -  Level 3 Electronic Signature(s) Signed: 12/20/2020 4:32:39 PM By: Antonieta Iba Entered By: Antonieta Iba on 12/20/2020 10:03:16 -------------------------------------------------------------------------------- Encounter Discharge Information Details Patient Name: Date of Service: Jeffery Munoz, Jeffery RENCE E. 12/20/2020 8:15 A M Medical Record Number: [id number] Patient Account Number: [id number] Date of Birth/Sex: Treating RN: 1955/03/24 (65 y.o. [name] Primary Care Wilma Michaelson: PCP, NO Other Clinician: Referring Remie Mathison: Treating Imo Cumbie/Extender: Bary Leriche in Treatment: 0 Encounter Discharge Information Items Post Procedure Vitals Discharge Condition: Stable Temperature (F): 98.8 Ambulatory Status: Ambulatory Pulse (bpm): 62 Discharge Destination: Home Respiratory Rate (breaths/min): 16 Transportation: Private Auto Blood Pressure (mmHg): 198/114 Schedule Follow-up Appointment: Yes Clinical Summary of Care: Provided on 12/20/2020 Form Type Recipient Paper Patient Patient Electronic Signature(s) Signed: 12/20/2020 4:32:39 PM By: Antonieta Iba Entered By: Antonieta Iba on 12/20/2020 10:13:08 -------------------------------------------------------------------------------- Lower Extremity Assessment Details Patient Name: Date of Service: Jeffery Munoz, Jeffery RENCE E. 12/20/2020 8:15 A M Medical Record Number: [id number] Patient Account Number: [id number] Date of Birth/Sex: Treating RN: 04-Apr-1955 (65 y.o. [name] Primary Care Shaunak Kreis: PCP, NO Other Clinician: Referring Desirie Minteer: Treating Jniyah Dantuono/Extender: Bary Leriche in Treatment: 0 Edema Assessment Assessed: Kyra Searles:  No] Franne Forts: Yes] Edema: [Left: N] [Right: o] Calf Left: Right: Point of Measurement: 33 cm From Medial Instep 32.8 cm Ankle Left: Right: Point of Measurement: 10 cm From Medial Instep 21.7 cm Knee To Floor Left: Right: From Medial Instep 42 cm Vascular Assessment Pulses: Dorsalis Pedis Palpable: [Right:Yes] Doppler Audible: [Right:Yes] Blood Pressure: Brachial: [Right:198] Ankle: [Right:Dorsalis Pedis: 218 1.10] Electronic Signature(s) Signed: 12/20/2020 4:32:39 PM By: Antonieta Iba Entered By: Antonieta Iba on 12/20/2020 10:13:30 -------------------------------------------------------------------------------- Multi Wound Chart Details Patient Name: Date of Service: Jeffery Munoz, Jeffery RENCE E. 12/20/2020 8:15 A M Medical Record Number: [id number] Patient Account Number: [id number] Date of Birth/Sex: Treating RN: [birth year] (65 y.o. [name] Primary Care Rhylee Nunn: PCP, NO Other Clinician: Referring Hadessah Grennan: Treating Jarrette Dehner/Extender: Bary Leriche in Treatment: 0 Vital Signs Height(in): Pulse(bpm): 62 Weight(lbs): Blood Pressure(mmHg): 198/114 Body Mass Index(BMI): Temperature(F): 98.8 Respiratory Rate(breaths/min): 16 Photos: [N/A:N/A] Right, Medial Ankle N/A N/A Wound Location: Trauma N/A N/A Wounding Event: Trauma, Other N/A N/A Primary Etiology: Congestive Heart Failure, N/A N/A Comorbid History: Hypertension, Myocardial Infarction 11/06/2020 N/A N/A Date Acquired: 0 N/A N/A Weeks of Treatment: Open N/A N/A Wound Status: 2.5x4.2x0.8 N/A N/A Measurements L x W x D (cm) 8.247 N/A N/A A (cm) : rea 6.597 N/A N/A Volume (cm) : 0.00% N/A N/A % Reduction in A rea: 0.00% N/A N/A % Reduction in Volume: Full Thickness Without Exposed N/A N/A Classification: Support Structures Medium N/A N/A Exudate A mount: Serosanguineous N/A N/A Exudate Type: red, brown N/A N/A Exudate Color: Distinct, outline attached N/A  N/A Wound Margin: Medium (34-66%) N/A N/A Granulation A mount: Red N/A N/A Granulation Quality: Medium (34-66%) N/A N/A Necrotic A mount: Fat Layer (Subcutaneous Tissue): Yes N/A N/A Exposed Structures: Fascia: No Tendon: No Muscle: No Joint: No Bone: No None N/A N/A Epithelialization: Debridement - Excisional N/A N/A Debridement: Pre-procedure Verification/Time Out 09:49 N/A N/A Taken: Other N/A N/A Pain Control: Subcutaneous, Slough N/A N/A Tissue Debrided: Skin/Subcutaneous Tissue N/A N/A Level: 10.5 N/A N/A Debridement A (sq cm): rea Curette N/A N/A Instrument: Moderate N/A N/A Bleeding: Pressure N/A N/A Hemostasis A chieved: Procedure was tolerated well N/A N/A Debridement Treatment Response: 2.5x4.2x0.8 N/A N/A Post Debridement Measurements L x W x D (cm) 6.597 N/A N/A Post Debridement Volume: (cm) Debridement N/A  N/A Procedures Performed: Treatment Notes Electronic Signature(s) Signed: 12/20/2020 4:50:06 PM By: Fonnie Mu RN Signed: 12/20/2020 4:56:56 PM By: Baltazar Najjar MD Entered By: Baltazar Najjar on 12/20/2020 10:08:42 -------------------------------------------------------------------------------- Multi-Disciplinary Care Plan Details Patient Name: Date of Service: Jeffery Munoz, Jeffery RENCE E. 12/20/2020 8:15 A M Medical Record Number: [id number] Patient Account Number: [id number] Date of Birth/Sex: Treating RN: August 31, 1955 (65 y.o. [name] Primary Care Corlis Angelica: PCP, NO Other Clinician: Referring Yared Susan: Treating Lorry Anastasi/Extender: Bary Leriche in Treatment: 0 Active Inactive Orientation to the Wound Care Program Nursing Diagnoses: Knowledge deficit related to the wound healing center program Goals: Patient/caregiver will verbalize understanding of the Wound Healing Center Program Date Initiated: 12/20/2020 Target Resolution Date: 01/24/2021 Goal Status: Active Interventions: Provide education  on orientation to the wound center Notes: Wound/Skin Impairment Nursing Diagnoses: Impaired tissue integrity Goals: Patient/caregiver will verbalize understanding of skin care regimen Date Initiated: 12/20/2020 Target Resolution Date: 01/24/2021 Goal Status: Active Ulcer/skin breakdown will have a volume reduction of 30% by week 4 Date Initiated: 12/20/2020 Target Resolution Date: 01/24/2021 Goal Status: Active Interventions: Assess patient/caregiver ability to obtain necessary supplies Assess patient/caregiver ability to perform ulcer/skin care regimen upon admission and as needed Assess ulceration(s) every visit Provide education on ulcer and skin care Treatment Activities: Topical wound management initiated : 12/20/2020 Notes: Electronic Signature(s) Signed: 12/20/2020 9:45:18 AM By: Antonieta Iba Entered By: Antonieta Iba on 12/20/2020 09:45:18 -------------------------------------------------------------------------------- Pain Assessment Details Patient Name: Date of Service: Jeffery Munoz, Jeffery RENCE E. 12/20/2020 8:15 A M Medical Record Number: [id number] Patient Account Number: [id number] Date of Birth/Sex: Treating RN: 08-16-1955 (65 y.o. [name] Primary Care Vandana Haman: PCP, NO Other Clinician: Referring Ryana Montecalvo: Treating Jezabelle Chisolm/Extender: Bary Leriche in Treatment: 0 Active Problems Location of Pain Severity and Description of Pain Patient Has Paino No Site Locations Pain Management and Medication Current Pain Management: Electronic Signature(s) Signed: 12/20/2020 4:32:39 PM By: Antonieta Iba Entered By: Antonieta Iba on 12/20/2020 09:31:56 -------------------------------------------------------------------------------- Patient/Caregiver Education Details Patient Name: Date of Service: Jeffery Munoz 10/18/2022andnbsp8:15 A M Medical Record Number: [id number] Patient Account Number: [id number] Date of  Birth/Gender: Treating RN: [birth year] (65 y.o. [name] Primary Care Physician: PCP, NO Other Clinician: Referring Physician: Treating Physician/Extender: Bary Leriche in Treatment: 0 Education Assessment Education Provided To: Patient Education Topics Provided Welcome [facility]: o Handouts: Welcome T [facility] o Methods: Explain/Verbal, Printed Responses: State content correctly Wound/Skin Impairment: Methods: Demonstration, Explain/Verbal Responses: State content correctly Electronic Signature(s) Signed: 12/20/2020 4:32:39 PM By: Antonieta Iba Entered By: Antonieta Iba on 12/20/2020 09:45:52 -------------------------------------------------------------------------------- Wound Assessment Details Patient Name: Date of Service: Jeffery Munoz, Jeffery RENCE E. 12/20/2020 8:15 A M Medical Record Number: [id number] Patient Account Number: [id number] Date of Birth/Sex: Treating RN: 1956/01/27 (65 y.o. [name] Primary Care Maya Scholer: PCP, NO Other Clinician: Referring Chellsea Beckers: Treating Masayuki Sakai/Extender: Bary Leriche in Treatment: 0 Wound Status Wound Number: 1 Primary Etiology: Trauma, Other Wound Location: Right, Medial Ankle Wound Status: Open Wounding Event: Trauma Comorbid Congestive Heart Failure, Hypertension, Myocardial History: Infarction Date Acquired: 11/06/2020 Weeks Of Treatment: 0 Clustered Wound: No Photos Wound Measurements Length: (cm) 2.5 Width: (cm) 4.2 Depth: (cm) 0.8 Area: (cm) 8.247 Volume: (cm) 6.597 % Reduction in Area: 0% % Reduction in Volume: 0% Epithelialization: None Tunneling: No Undermining: No Wound Description Classification: Full Thickness Without Exposed Support Structures Wound Margin: Distinct, outline attached Exudate Amount: Medium Exudate  Type: Serosanguineous Exudate Color: red, brown Foul Odor After Cleansing: No Slough/Fibrino  Yes Wound Bed Granulation Amount: Medium (34-66%) Exposed Structure Granulation Quality: Red Fascia Exposed: No Necrotic Amount: Medium (34-66%) Fat Layer (Subcutaneous Tissue) Exposed: Yes Necrotic Quality: Adherent Slough Tendon Exposed: No Muscle Exposed: No Joint Exposed: No Bone Exposed: No Treatment Notes Wound #1 (Ankle) Wound Laterality: Right, Medial Cleanser Soap and Water Discharge Instruction: May shower and wash wound with dial antibacterial soap and water prior to dressing change. Wound Cleanser Discharge Instruction: Cleanse the wound with wound cleanser prior to applying a clean dressing using gauze sponges, not tissue or cotton balls. Peri-Wound Care Skin Prep Discharge Instruction: Use skin prep as directed Topical Gentamicin Discharge Instruction: As directed by physician Primary Dressing Hydrofera Blue Ready Foam, 2.5 x2.5 in Discharge Instruction: Apply to wound bed as instructed Secondary Dressing Zetuvit Plus Silicone Border Dressing 5x5 (in/in) Discharge Instruction: Apply silicone border over primary dressing as directed. Secured With Compression Wrap Compression Stockings Facilities manager) Signed: 12/20/2020 4:32:39 PM By: Antonieta Iba Entered By: Antonieta Iba on 12/20/2020 09:27:30 -------------------------------------------------------------------------------- Vitals Details Patient Name: Date of Service: Jeffery Munoz, Jeffery RENCE E. 12/20/2020 8:15 A M Medical Record Number: [id number] Patient Account Number: [id number] Date of Birth/Sex: Treating RN: Mar 14, 1955 (65 y.o. [name] Primary Care Kamauri Denardo: PCP, NO Other Clinician: Referring Allora Bains: Treating Joandry Slagter/Extender: Bary Leriche in Treatment: 0 Vital Signs Time Taken: 09:10 Temperature (F): 98.8 Pulse (bpm): 62 Respiratory Rate (breaths/min): 16 Blood Pressure (mmHg): 198/114 Reference Range: 80 - 120 mg / dl Electronic  Signature(s) Signed: 12/20/2020 9:44:01 AM By: Antonieta Iba Entered By: Antonieta Iba on 12/20/2020 09:44:01

## 2020-12-27 ENCOUNTER — Other Ambulatory Visit: Payer: Self-pay

## 2020-12-27 ENCOUNTER — Encounter (HOSPITAL_BASED_OUTPATIENT_CLINIC_OR_DEPARTMENT_OTHER): Payer: Medicare Other | Admitting: Internal Medicine

## 2020-12-27 DIAGNOSIS — S91031A Puncture wound without foreign body, right ankle, initial encounter: Secondary | ICD-10-CM | POA: Diagnosis not present

## 2021-01-03 ENCOUNTER — Other Ambulatory Visit: Payer: Self-pay

## 2021-01-03 ENCOUNTER — Encounter (HOSPITAL_BASED_OUTPATIENT_CLINIC_OR_DEPARTMENT_OTHER): Payer: Medicare Other | Attending: Internal Medicine | Admitting: Internal Medicine

## 2021-01-03 DIAGNOSIS — I509 Heart failure, unspecified: Secondary | ICD-10-CM | POA: Diagnosis not present

## 2021-01-03 DIAGNOSIS — I110 Hypertensive heart disease with heart failure: Secondary | ICD-10-CM | POA: Diagnosis not present

## 2021-01-03 DIAGNOSIS — E785 Hyperlipidemia, unspecified: Secondary | ICD-10-CM | POA: Diagnosis not present

## 2021-01-03 DIAGNOSIS — I2510 Atherosclerotic heart disease of native coronary artery without angina pectoris: Secondary | ICD-10-CM | POA: Insufficient documentation

## 2021-01-03 DIAGNOSIS — L97318 Non-pressure chronic ulcer of right ankle with other specified severity: Secondary | ICD-10-CM | POA: Diagnosis present

## 2021-01-03 NOTE — Progress Notes (Signed)
Jeffery Munoz, Jeffery Munoz ([id number]) Visit Report for 01/03/2021 Arrival Information Details Patient Name: Date of Service: Jeffery Munoz Jeffery E. 01/03/2021 2:00 PM Medical Record Number: [id number] Patient Account Number: [id number] Date of Birth/Sex: Treating RN: 12-23-1955 (65 y.o. [name], Lauren Primary Care Anthem Frazer: PCP, NO Other Clinician: Referring Najeeb Uptain: Treating [name]/Extender: Bary Leriche in Treatment: 2 Visit Information History Since Last Visit Added or deleted any medications: No Patient Arrived: Ambulatory Any new allergies or adverse reactions: No Arrival Time: 14:10 Had a fall or experienced change in No Accompanied By: self activities of daily living that may affect Transfer Assistance: None risk of falls: Patient Identification Verified: Yes Signs or symptoms of abuse/neglect since last visito No Secondary Verification Process Completed: Yes Hospitalized since last visit: No Patient Requires Transmission-Based Precautions: No Implantable device outside of the clinic excluding No Patient Has Alerts: No cellular tissue based products placed in the center since last visit: Has Dressing in Place as Prescribed: Yes Pain Present Now: No Electronic Signature(s) Signed: 01/03/2021 4:48:23 PM By: Fonnie Mu RN Entered By: Fonnie Mu on 01/03/2021 14:10:41 -------------------------------------------------------------------------------- Clinic Level of Care Assessment Details Patient Name: Date of Service: Jeffery Munoz Jeffery E. 01/03/2021 2:00 PM Medical Record Number: [id number] Patient Account Number: [id number] Date of Birth/Sex: Treating RN: 1956-01-24 (65 y.o. [name], Lauren Primary Care Anaisabel Pederson: PCP, NO Other Clinician: Referring Fae Blossom: Treating Loveah Like/Extender: Bary Leriche in Treatment: 2 Clinic Level of Care Assessment Items TOOL 4 Quantity Score X- 1 0 Use when only an EandM  is performed on FOLLOW-UP visit ASSESSMENTS - Nursing Assessment / Reassessment X- 1 10 Reassessment of Co-morbidities (includes updates in patient status) X- 1 5 Reassessment of Adherence to Treatment Plan ASSESSMENTS - Wound and Skin A ssessment / Reassessment X - Simple Wound Assessment / Reassessment - one wound 1 5 []  - 0 Complex Wound Assessment / Reassessment - multiple wounds []  - 0 Dermatologic / Skin Assessment (not related to wound area) ASSESSMENTS - Focused Assessment X- 1 5 Circumferential Edema Measurements - multi extremities []  - 0 Nutritional Assessment / Counseling / Intervention []  - 0 Lower Extremity Assessment (monofilament, tuning fork, pulses) []  - 0 Peripheral Arterial Disease Assessment (using hand held doppler) ASSESSMENTS - Ostomy and/or Continence Assessment and Care []  - 0 Incontinence Assessment and Management []  - 0 Ostomy Care Assessment and Management (repouching, etc.) PROCESS - Coordination of Care X - Simple Patient / Family Education for ongoing care 1 15 []  - 0 Complex (extensive) Patient / Family Education for ongoing care X- 1 10 Staff obtains , Records, T Results / Process Orders est []  - 0 Staff telephones HHA, Nursing Homes / Clarify orders / etc []  - 0 Routine Transfer to another Facility (non-emergent condition) []  - 0 Routine Hospital Admission (non-emergent condition) []  - 0 New Admissions / / Ordering NPWT Apligraf, etc. , []  - 0 Emergency Hospital Admission (emergent condition) X- 1 10 Simple Discharge Coordination []  - 0 Complex (extensive) Discharge Coordination PROCESS - Special Needs []  - 0 Pediatric / Minor Patient Management []  - 0 Isolation Patient Management []  - 0 Hearing / Language / Visual special needs []  - 0 Assessment of Community assistance (transportation, D/C planning, etc.) []  - 0 Additional assistance / Altered mentation []  - 0 Support Surface(s) Assessment  (bed, cushion, seat, etc.) INTERVENTIONS - Wound Cleansing / Measurement X - Simple Wound Cleansing - one wound 1 5 []  - 0 Complex Wound Cleansing -  multiple wounds X- 1 5 Wound Imaging (photographs - any number of wounds) []  - 0 Wound Tracing (instead of photographs) X- 1 5 Simple Wound Measurement - one wound []  - 0 Complex Wound Measurement - multiple wounds INTERVENTIONS - Wound Dressings X - Small Wound Dressing one or multiple wounds 1 10 []  - 0 Medium Wound Dressing one or multiple wounds []  - 0 Large Wound Dressing one or multiple wounds X- 1 5 Application of Medications - topical []  - 0 Application of Medications - injection INTERVENTIONS - Miscellaneous []  - 0 External ear exam []  - 0 Specimen Collection (cultures, biopsies, blood, body fluids, etc.) []  - 0 Specimen(s) / Culture(s) sent or taken to Lab for analysis []  - 0 Patient Transfer (multiple staff / / Similar devices) []  - 0 Simple Staple / Suture removal (25 or less) []  - 0 Complex Staple / Suture removal (26 or more) []  - 0 Hypo / Hyperglycemic Management (close monitor of Blood Glucose) []  - 0 Ankle / Brachial Index (ABI) - do not check if billed separately X- 1 5 Vital Signs Has the patient been seen at the hospital within the last three years: Yes Total Score: 95 Level Of Care: New/Established - Level 3 Electronic Signature(s) Signed: 01/03/2021 4:48:23 PM By: RN Entered By: on 01/03/2021 14:34:25 -------------------------------------------------------------------------------- Encounter Discharge Information Details Patient Name: Date of Service: , Jeffery Jeffery E. 01/03/2021 2:00 PM Medical Record Number: Patient Account Number: Date of Birth/Sex: Treating RN: 01/22/1956 (65 y.o. Primary Care Kisha Messman: PCP, NO Other Clinician: Referring Aarica Wax: Treating Teigan Manner/Extender: in Treatment: 2 Encounter Discharge Information Items Discharge Condition: Stable Ambulatory Status: Ambulatory Discharge Destination: Home Transportation: Private Auto Accompanied By: self Schedule Follow-up Appointment: Yes Clinical Summary of Care: Patient Declined Electronic Signature(s) Signed: 01/03/2021 4:48:23 PM By: 13/03/2020 RN Entered By: Fonnie Mu on 01/03/2021 14:36:27 -------------------------------------------------------------------------------- Lower Extremity Assessment Details Patient Name: Date of Service: 13/03/2020, Jeffery Jeffery E. 01/03/2021 2:00 PM Medical Record Number: 13/03/2020 Patient Account Number: [id number] Date of Birth/Sex: Treating RN: 1955/07/08 (65 y.o. 76 Primary Care Savanah Bayles: PCP, NO Other Clinician: Referring Nikiah Goin: Treating Garron Eline/Extender: [name] in Treatment: 2 Edema Assessment Assessed: Bary Leriche: No] 13/03/2020: Yes] Edema: [Left: N] [Right: o] Calf Left: Right: Point of Measurement: 33 cm From Medial Instep 32 cm Ankle Left: Right: Point of Measurement: 10 cm From Medial Instep 21 cm Vascular Assessment Pulses: Dorsalis Pedis Palpable: [Right:Yes] Posterior Tibial Palpable: [Right:Yes] Electronic Signature(s) Signed: 01/03/2021 4:48:23 PM By: Fonnie Mu RN Entered By: 13/03/2020 on 01/03/2021 14:12:34 -------------------------------------------------------------------------------- Multi Wound Chart Details Patient Name: Date of Service: 13/03/2020, Jeffery Jeffery E. 01/03/2021 2:00 PM Medical Record Number: [id number] Patient Account Number: 03/05/1956 Date of Birth/Sex: Treating RN: 10-10-1955 (65 y.o. Bary Leriche Primary Care Teairra Millar: PCP, NO Other Clinician: Referring Maryl Blalock: Treating Caine Barfield/Extender: Kyra Searles in Treatment: 2 Vital Signs Height(in): Pulse(bpm): 88 Weight(lbs): Blood Pressure(mmHg):  169/81 Body Mass Index(BMI): Temperature(F): 98.9 Respiratory Rate(breaths/min): 17 Photos: [N/A:N/A] Right, Medial Ankle N/A N/A Wound Location: Trauma N/A N/A Wounding Event: Trauma, Other N/A N/A Primary Etiology: Asthma, Congestive Heart Failure, N/A N/A Comorbid History: Hypertension, Myocardial Infarction 11/06/2020 N/A N/A Date Acquired: 2 N/A N/A Weeks of Treatment: Open N/A N/A Wound Status: 1.8x3.7x0.2 N/A N/A Measurements L x W x D (cm) 5.231 N/A N/A A (cm) : rea 1.046 N/A N/A  Volume (cm) : 36.60% N/A N/A % Reduction in Area: 84.10% N/A N/A % Reduction in Volume: Full Thickness Without Exposed N/A N/A Classification: Support Structures Medium N/A N/A Exudate Amount: Serosanguineous N/A N/A Exudate Type: red, brown N/A N/A Exudate Color: Distinct, outline attached N/A N/A Wound Margin: Large (67-100%) N/A N/A Granulation Amount: Red N/A N/A Granulation Quality: Small (1-33%) N/A N/A Necrotic Amount: Fat Layer (Subcutaneous Tissue): Yes N/A N/A Exposed Structures: Fascia: No Tendon: No Muscle: No Joint: No Bone: No None N/A N/A Epithelialization: Treatment Notes Wound #1 (Ankle) Wound Laterality: Right, Medial Cleanser Soap and Water Discharge Instruction: May shower and wash wound with dial antibacterial soap and water prior to dressing change. Wound Cleanser Discharge Instruction: Cleanse the wound with wound cleanser prior to applying a clean dressing using gauze sponges, not tissue or cotton balls. Peri-Wound Care Skin Prep Discharge Instruction: Use skin prep as directed Topical Gentamicin Discharge Instruction: As directed by physician Primary Dressing Hydrofera Blue Ready Foam, 2.5 x2.5 in Discharge Instruction: Apply to wound bed as instructed Secondary Dressing Zetuvit Plus Silicone Border Dressing 5x5 (in/in) Discharge Instruction: Apply silicone border over primary dressing as directed. Secured With Compression  Wrap Compression Stockings Facilities manager) Signed: 01/03/2021 4:29:54 PM By: Baltazar Najjar MD Signed: 01/03/2021 4:48:23 PM By: Fonnie Mu RN Entered By: Baltazar Najjar on 01/03/2021 14:43:52 -------------------------------------------------------------------------------- Multi-Disciplinary Care Plan Details Patient Name: Date of Service: Jeffery Munoz, Jeffery Jeffery E. 01/03/2021 2:00 PM Medical Record Number: [id number] Patient Account Number: [id number] Date of Birth/Sex: Treating RN: 1955/10/31 (65 y.o. [name] Primary Care Trung Wenzl: PCP, NO Other Clinician: Referring Shantinique Picazo: Treating Leif Loflin/Extender: Bary Leriche in Treatment: 2 Active Inactive Wound/Skin Impairment Nursing Diagnoses: Impaired tissue integrity Goals: Patient/caregiver will verbalize understanding of skin care regimen Date Initiated: 12/20/2020 Target Resolution Date: 01/24/2021 Goal Status: Active Ulcer/skin breakdown will have a volume reduction of 30% by week 4 Date Initiated: 12/20/2020 Target Resolution Date: 01/24/2021 Goal Status: Active Interventions: Assess patient/caregiver ability to obtain necessary supplies Assess patient/caregiver ability to perform ulcer/skin care regimen upon admission and as needed Assess ulceration(s) every visit Provide education on ulcer and skin care Treatment Activities: Topical wound management initiated : 12/20/2020 Notes: Electronic Signature(s) Signed: 01/03/2021 4:48:23 PM By: Fonnie Mu RN Entered By: Fonnie Mu on 01/03/2021 14:16:21 -------------------------------------------------------------------------------- Pain Assessment Details Patient Name: Date of Service: Jeffery Munoz, Jeffery Jeffery E. 01/03/2021 2:00 PM Medical Record Number: [id number] Patient Account Number: [id number] Date of Birth/Sex: Treating RN: 08-17-1955 (65 y.o. [name] Primary Care Kemari Mares: PCP, NO Other  Clinician: Referring Janesia Joswick: Treating Zyaire Dumas/Extender: Bary Leriche in Treatment: 2 Active Problems Location of Pain Severity and Description of Pain Patient Has Paino No Site Locations Pain Management and Medication Current Pain Management: Electronic Signature(s) Signed: 01/03/2021 4:48:23 PM By: Fonnie Mu RN Entered By: Fonnie Mu on 01/03/2021 14:12:24 -------------------------------------------------------------------------------- Patient/Caregiver Education Details Patient Name: Date of Service: [name]. 11/1/2022andnbsp2:00 PM Medical Record Number: [id number] Patient Account Number: [id number] Date of Birth/Gender: Treating RN: [birth year] (65 y.o. [name] Primary Care Physician: PCP, NO Other Clinician: Referring Physician: Treating Physician/Extender: Bary Leriche in Treatment: 2 Education Assessment Education Provided To: Patient Education Topics Provided Wound/Skin Impairment: Methods: Explain/Verbal Responses: State content correctly Electronic Signature(s) Signed: 01/03/2021 4:48:23 PM By: Fonnie Mu RN Entered By: Fonnie Mu on 01/03/2021 14:21:39 -------------------------------------------------------------------------------- Wound Assessment Details Patient Name: Date of Service: Jeffery Munoz, Jeffery Jeffery E. 01/03/2021 2:00 PM  Medical Record Number: [id number] Patient Account Number: [id number] Date of Birth/Sex: Treating RN: 07-30-1955 (65 y.o. [name], Lauren Primary Care Rosslyn Pasion: PCP, NO Other Clinician: Referring French Kendra: Treating Hayleigh Bawa/Extender: Bary Leriche in Treatment: 2 Wound Status Wound Number: 1 Primary Trauma, Other Etiology: Wound Location: Right, Medial Ankle Wound Status: Open Wounding Event: Trauma Comorbid Asthma, Congestive Heart Failure, Hypertension, Myocardial Date Acquired: 11/06/2020 History:  Infarction Weeks Of Treatment: 2 Clustered Wound: No Photos Wound Measurements Length: (cm) 1.8 Width: (cm) 3.7 Depth: (cm) 0.2 Area: (cm) 5.231 Volume: (cm) 1.046 % Reduction in Area: 36.6% % Reduction in Volume: 84.1% Epithelialization: None Tunneling: No Undermining: No Wound Description Classification: Full Thickness Without Exposed Support Structures Wound Margin: Distinct, outline attached Exudate Amount: Medium Exudate Type: Serosanguineous Exudate Color: red, brown Foul Odor After Cleansing: No Slough/Fibrino Yes Wound Bed Granulation Amount: Large (67-100%) Exposed Structure Granulation Quality: Red Fascia Exposed: No Necrotic Amount: Small (1-33%) Fat Layer (Subcutaneous Tissue) Exposed: Yes Tendon Exposed: No Muscle Exposed: No Joint Exposed: No Bone Exposed: No Treatment Notes Wound #1 (Ankle) Wound Laterality: Right, Medial Cleanser Soap and Water Discharge Instruction: May shower and wash wound with dial antibacterial soap and water prior to dressing change. Wound Cleanser Discharge Instruction: Cleanse the wound with wound cleanser prior to applying a clean dressing using gauze sponges, not tissue or cotton balls. Peri-Wound Care Skin Prep Discharge Instruction: Use skin prep as directed Topical Gentamicin Discharge Instruction: As directed by physician Primary Dressing Hydrofera Blue Ready Foam, 2.5 x2.5 in Discharge Instruction: Apply to wound bed as instructed Secondary Dressing Zetuvit Plus Silicone Border Dressing 5x5 (in/in) Discharge Instruction: Apply silicone border over primary dressing as directed. Secured With Compression Wrap Compression Stockings Facilities manager) Signed: 01/03/2021 4:48:23 PM By: Fonnie Mu RN Signed: 01/03/2021 5:36:20 PM By: Zandra Abts RN, BSN Entered By: Zandra Abts on 01/03/2021 14:19:59 -------------------------------------------------------------------------------- Vitals  Details Patient Name: Date of Service: Jeffery Munoz, Jeffery Jeffery E. 01/03/2021 2:00 PM Medical Record Number: [id number] Patient Account Number: [id number] Date of Birth/Sex: Treating RN: 01/16/1956 (65 y.o. [name] Primary Care Khai Arrona: PCP, NO Other Clinician: Referring Dezyrae Kensinger: Treating Niels Cranshaw/Extender: Bary Leriche in Treatment: 2 Vital Signs Time Taken: 14:12 Temperature (F): 98.9 Pulse (bpm): 88 Respiratory Rate (breaths/min): 17 Blood Pressure (mmHg): 169/81 Reference Range: 80 - 120 mg / dl Electronic Signature(s) Signed: 01/03/2021 4:48:23 PM By: Fonnie Mu RN Entered By: Fonnie Mu on 01/03/2021 14:13:53
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
## 2021-01-10 ENCOUNTER — Other Ambulatory Visit: Payer: Self-pay

## 2021-01-10 ENCOUNTER — Encounter (HOSPITAL_BASED_OUTPATIENT_CLINIC_OR_DEPARTMENT_OTHER): Payer: Medicare Other | Admitting: Internal Medicine

## 2021-01-10 DIAGNOSIS — L97318 Non-pressure chronic ulcer of right ankle with other specified severity: Secondary | ICD-10-CM | POA: Diagnosis not present
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
## 2021-01-11 ENCOUNTER — Ambulatory Visit: Payer: Medicare Other | Admitting: Nurse Practitioner

## 2021-01-11 NOTE — Progress Notes (Signed)
Jeffery Munoz ([id number]) Visit Report for 01/10/2021 Arrival Information Details Patient Name: Date of Service: Jeffery Munoz 01/10/2021 3:15 PM Medical Record Number: [id number] Patient Account Number: [id number] Date of Birth/Sex: Treating RN: June 22, 1955 (65 y.o. Jeffery Munoz, Lauren Primary Care Adler Chartrand: PCP, NO Other Clinician: Referring Kendle Erker: Treating Kaylin Schellenberg/Extender: Bary Leriche in Treatment: 3 Visit Information History Since Last Visit Added or deleted any medications: No Patient Arrived: Ambulatory Any new allergies or adverse reactions: No Arrival Time: 14:10 Had a fall or experienced change in No Accompanied By: self activities of daily living that may affect Transfer Assistance: None risk of falls: Patient Identification Verified: Yes Signs or symptoms of abuse/neglect since last visito No Secondary Verification Process Completed: Yes Hospitalized since last visit: No Patient Requires Transmission-Based Precautions: No Implantable device outside of the clinic excluding No Patient Has Alerts: No cellular tissue based products placed in the center since last visit: Has Dressing in Place as Prescribed: Yes Pain Present Now: Yes Electronic Signature(s) Signed: 01/10/2021 4:45:49 PM By: Fonnie Mu RN Entered By: Fonnie Mu on 01/10/2021 14:11:49 -------------------------------------------------------------------------------- Clinic Level of Care Assessment Details Patient Name: Date of Service: Jeffery Munoz. 01/10/2021 3:15 PM Medical Record Number: [id number] Patient Account Number: [id number] Date of Birth/Sex: Treating RN: 28-Feb-1956 (65 y.o. Jeffery Munoz, Lauren Primary Care Trinka Keshishyan: PCP, NO Other Clinician: Referring Agness Sibrian: Treating Chavon Lucarelli/Extender: Bary Leriche in Treatment: 3 Clinic Level of Care Assessment Items TOOL 4 Quantity Score X- 1 0 Use when only an  EandM is performed on FOLLOW-UP visit ASSESSMENTS - Nursing Assessment / Reassessment X- 1 10 Reassessment of Co-morbidities (includes updates in patient status) X- 1 5 Reassessment of Adherence to Treatment Plan ASSESSMENTS - Wound and Skin A ssessment / Reassessment X - Simple Wound Assessment / Reassessment - one wound 1 5 []  - 0 Complex Wound Assessment / Reassessment - multiple wounds X- 1 10 Dermatologic / Skin Assessment (not related to wound area) ASSESSMENTS - Focused Assessment X- 1 5 Circumferential Edema Measurements - multi extremities []  - 0 Nutritional Assessment / Counseling / Intervention []  - 0 Lower Extremity Assessment (monofilament, tuning fork, pulses) []  - 0 Peripheral Arterial Disease Assessment (using hand held doppler) ASSESSMENTS - Ostomy and/or Continence Assessment and Care []  - 0 Incontinence Assessment and Management []  - 0 Ostomy Care Assessment and Management (repouching, etc.) PROCESS - Coordination of Care X - Simple Patient / Family Education for ongoing care 1 15 []  - 0 Complex (extensive) Patient / Family Education for ongoing care X- 1 10 Staff obtains , Records, T Results / Process Orders est []  - 0 Staff telephones HHA, Nursing Homes / Clarify orders / etc []  - 0 Routine Transfer to another Facility (non-emergent condition) []  - 0 Routine Hospital Admission (non-emergent condition) []  - 0 New Admissions / / Ordering NPWT Apligraf, etc. , []  - 0 Emergency Hospital Admission (emergent condition) X- 1 10 Simple Discharge Coordination []  - 0 Complex (extensive) Discharge Coordination PROCESS - Special Needs []  - 0 Pediatric / Minor Patient Management []  - 0 Isolation Patient Management []  - 0 Hearing / Language / Visual special needs []  - 0 Assessment of Community assistance (transportation, D/C planning, etc.) []  - 0 Additional assistance / Altered mentation []  - 0 Support Surface(s)  Assessment (bed, cushion, seat, etc.) INTERVENTIONS - Wound Cleansing / Measurement X - Simple Wound Cleansing - one wound 1 5 []  - 0 Complex Wound Cleansing -  multiple wounds X- 1 5 Wound Imaging (photographs - any number of wounds) []  - 0 Wound Tracing (instead of photographs) X- 1 5 Simple Wound Measurement - one wound []  - 0 Complex Wound Measurement - multiple wounds INTERVENTIONS - Wound Dressings X - Small Wound Dressing one or multiple wounds 1 10 []  - 0 Medium Wound Dressing one or multiple wounds []  - 0 Large Wound Dressing one or multiple wounds []  - 0 Application of Medications - topical []  - 0 Application of Medications - injection INTERVENTIONS - Miscellaneous []  - 0 External ear exam []  - 0 Specimen Collection (cultures, biopsies, blood, body fluids, etc.) []  - 0 Specimen(s) / Culture(s) sent or taken to Lab for analysis []  - 0 Patient Transfer (multiple staff / / Similar devices) []  - 0 Simple Staple / Suture removal (25 or less) []  - 0 Complex Staple / Suture removal (26 or more) []  - 0 Hypo / Hyperglycemic Management (close monitor of Blood Glucose) []  - 0 Ankle / Brachial Index (ABI) - do not check if billed separately X- 1 5 Vital Signs Has the patient been seen at the hospital within the last three years: Yes Total Score: 100 Level Of Care: New/Established - Level 3 Electronic Signature(s) Signed: 01/10/2021 4:45:49 PM By: RN Entered By: on 01/10/2021 14:34:01 -------------------------------------------------------------------------------- Encounter Discharge Information Details Patient Name: Date of Service: , Jeffery Munoz. 01/10/2021 3:15 PM Medical Record Number: Patient Account Number: Date of Birth/Sex: Treating RN: [birth year] (65 y.o. Primary Care Zaccheaus Storlie: PCP, NO Other Clinician: Referring Phillipa Morden: Treating Antonis Lor/Extender: in Treatment: 3 Encounter Discharge Information Items Discharge Condition: Stable Ambulatory Status: Ambulatory Discharge Destination: Home Transportation: Private Auto Accompanied By: self Schedule Follow-up Appointment: Yes Clinical Summary of Care: Patient Declined Electronic Signature(s) Signed: 01/10/2021 4:45:49 PM By: RN Entered By: 13/10/2020 on 01/10/2021 14:34:56 -------------------------------------------------------------------------------- Lower Extremity Assessment Details Patient Name: Date of Service: Fonnie Mu, Jeffery Munoz. 01/10/2021 3:15 PM Medical Record Number: [name] Patient Account Number: 13/10/2020 Date of Birth/Sex: Treating RN: 1955-07-15 (65 y.o. 03/05/1956 Primary Care Jaquari Reckner: PCP, NO Other Clinician: Referring Jeanet Lupe: Treating Ranferi Clingan/Extender: 75 in Treatment: 3 Edema Assessment Assessed: [name]: No] Bary Leriche: Yes] Edema: [Left: N] [Right: o] Calf Left: Right: Point of Measurement: 33 cm From Medial Instep 32 cm Ankle Left: Right: Point of Measurement: 10 cm From Medial Instep 21 cm Vascular Assessment Pulses: Dorsalis Pedis Palpable: [Right:Yes] Posterior Tibial Palpable: [Right:Yes] Electronic Signature(s) Signed: 01/10/2021 4:45:49 PM By: Fonnie Mu RN Entered By: Fonnie Mu on 01/10/2021 14:14:19 -------------------------------------------------------------------------------- Multi Wound Chart Details Patient Name: Date of Service: [name], Jeffery Munoz. 01/10/2021 3:15 PM Medical Record Number: [id number] Patient Account Number: [id number] Date of Birth/Sex: Treating RN: May 25, 1955 (65 y.o. [name] Primary Care Laparis Durrett: PCP, NO Other Clinician: Referring Waverly Chavarria: Treating Sherese Heyward/Extender: Bary Leriche in Treatment: 3 Vital Signs Height(in): Pulse(bpm): 86 Weight(lbs): Blood  Pressure(mmHg): 209/119 Body Mass Index(BMI): Temperature(F): 98.3 Respiratory Rate(breaths/min): 17 Photos: [N/A:N/A] Right, Medial Ankle N/A N/A Wound Location: Trauma N/A N/A Wounding Event: Trauma, Other N/A N/A Primary Etiology: Asthma, Congestive Heart Failure, N/A N/A Comorbid History: Hypertension, Myocardial Infarction 11/06/2020 N/A N/A Date Acquired: 3 N/A N/A Weeks of Treatment: Open N/A N/A Wound Status: 1.1x3.1x0.2 N/A N/A Measurements L x W x D (cm) 2.678 N/A N/A A (cm) : rea 0.536 N/A N/A  Volume (cm) : 67.50% N/A N/A % Reduction in Area: 91.90% N/A N/A % Reduction in Volume: Full Thickness Without Exposed N/A N/A Classification: Support Structures Medium N/A N/A Exudate Amount: Serosanguineous N/A N/A Exudate Type: red, brown N/A N/A Exudate Color: Distinct, outline attached N/A N/A Wound Margin: Large (67-100%) N/A N/A Granulation Amount: Red N/A N/A Granulation Quality: Small (1-33%) N/A N/A Necrotic Amount: Fat Layer (Subcutaneous Tissue): Yes N/A N/A Exposed Structures: Fascia: No Tendon: No Muscle: No Joint: No Bone: No None N/A N/A Epithelialization: Treatment Notes Wound #1 (Ankle) Wound Laterality: Right, Medial Cleanser Soap and Water Discharge Instruction: May shower and wash wound with dial antibacterial soap and water prior to dressing change. Wound Cleanser Discharge Instruction: Cleanse the wound with wound cleanser prior to applying a clean dressing using gauze sponges, not tissue or cotton balls. Peri-Wound Care Skin Prep Discharge Instruction: Use skin prep as directed Topical Gentamicin Discharge Instruction: As directed by physician Primary Dressing Hydrofera Blue Ready Foam, 2.5 x2.5 in Discharge Instruction: Apply to wound bed as instructed Secondary Dressing Zetuvit Plus Silicone Border Dressing 5x5 (in/in) Discharge Instruction: Apply silicone border over primary dressing as directed. Secured  With Compression Wrap Compression Stockings Facilities manager) Signed: 01/10/2021 4:39:28 PM By: Baltazar Najjar MD Signed: 01/10/2021 4:45:49 PM By: Fonnie Mu RN Entered By: Baltazar Najjar on 01/10/2021 16:04:46 -------------------------------------------------------------------------------- Multi-Disciplinary Care Plan Details Patient Name: Date of Service: [name], Jeffery Munoz. 01/10/2021 3:15 PM Medical Record Number: [id number] Patient Account Number: [id number] Date of Birth/Sex: Treating RN: 1955-12-19 (65 y.o. [name] Primary Care Josep Luviano: PCP, NO Other Clinician: Referring Hailie Searight: Treating Dashea Mcmullan/Extender: Bary Leriche in Treatment: 3 Active Inactive Wound/Skin Impairment Nursing Diagnoses: Impaired tissue integrity Goals: Patient/caregiver will verbalize understanding of skin care regimen Date Initiated: 12/20/2020 Target Resolution Date: 01/24/2021 Goal Status: Active Ulcer/skin breakdown will have a volume reduction of 30% by week 4 Date Initiated: 12/20/2020 Target Resolution Date: 01/24/2021 Goal Status: Active Interventions: Assess patient/caregiver ability to obtain necessary supplies Assess patient/caregiver ability to perform ulcer/skin care regimen upon admission and as needed Assess ulceration(s) every visit Provide education on ulcer and skin care Treatment Activities: Topical wound management initiated : 12/20/2020 Notes: Electronic Signature(s) Signed: 01/10/2021 4:45:49 PM By: Fonnie Mu RN Entered By: Fonnie Mu on 01/10/2021 14:30:36 -------------------------------------------------------------------------------- Pain Assessment Details Patient Name: Date of Service: [name], Jeffery Munoz. 01/10/2021 3:15 PM Medical Record Number: [id number] Patient Account Number: [id number] Date of Birth/Sex: Treating RN: 1956/03/02 (65 y.o. [name] Primary Care  Tawonda Legaspi: PCP, NO Other Clinician: Referring Bejamin Hackbart: Treating Ridhi Hoffert/Extender: Bary Leriche in Treatment: 3 Active Problems Location of Pain Severity and Description of Pain Patient Has Paino Yes Site Locations With Dressing Change: Yes Duration of the Pain. Constant / Intermittento Constant Rate the pain. Current Pain Level: 7 Worst Pain Level: 10 Least Pain Level: 7 Tolerable Pain Level: 7 Character of Pain Describe the Pain: Aching Pain Management and Medication Current Pain Management: Medication: No Cold Application: No Rest: No Massage: No Activity: No T.Munoz.N.S.: No Heat Application: No Leg drop or elevation: No Is the Current Pain Management Adequate: Inadequate How does your wound impact your activities of daily livingo Sleep: No Bathing: No Appetite: No Relationship With Others: No Bladder Continence: No Emotions: No Bowel Continence: No Work: No Toileting: No Drive: No Dressing: No Hobbies: No Electronic Signature(s) Signed: 01/10/2021 4:45:49 PM By: Fonnie Mu RN Entered By: Fonnie Mu on 01/10/2021 14:14:10 -------------------------------------------------------------------------------- Patient/Caregiver Education  Details Patient Name: Date of Service: Jeffery Munoz 11/8/2022andnbsp3:15 PM Medical Record Number: [id number] Patient Account Number: [id number] Date of Birth/Gender: Treating RN: [birth year] (65 y.o. [name] Primary Care Physician: PCP, NO Other Clinician: Referring Physician: Treating Physician/Extender: Bary Leriche in Treatment: 3 Education Assessment Education Provided To: Patient Education Topics Provided Wound/Skin Impairment: Methods: Explain/Verbal Responses: Reinforcements needed, State content correctly Electronic Signature(s) Signed: 01/10/2021 4:45:49 PM By: Fonnie Mu RN Entered By: Fonnie Mu on 01/10/2021  14:31:34 -------------------------------------------------------------------------------- Wound Assessment Details Patient Name: Date of Service: [name], Jeffery Munoz. 01/10/2021 3:15 PM Medical Record Number: [id number] Patient Account Number: [id number] Date of Birth/Sex: Treating RN: [birth year] (65 y.o. Jeffery Munoz, Lauren Primary Care Paris Chiriboga: PCP, NO Other Clinician: Referring Rashaun Curl: Treating Nil Xiong/Extender: Bary Leriche in Treatment: 3 Wound Status Wound Number: 1 Primary Trauma, Other Etiology: Wound Location: Right, Medial Ankle Wound Status: Open Wounding Event: Trauma Comorbid Asthma, Congestive Heart Failure, Hypertension, Myocardial Date Acquired: 11/06/2020 History: Infarction Weeks Of Treatment: 3 Clustered Wound: No Photos Wound Measurements Length: (cm) 1.1 Width: (cm) 3.1 Depth: (cm) 0.2 Area: (cm) 2.678 Volume: (cm) 0.536 % Reduction in Area: 67.5% % Reduction in Volume: 91.9% Epithelialization: None Tunneling: No Undermining: No Wound Description Classification: Full Thickness Without Exposed Support Structures Wound Margin: Distinct, outline attached Exudate Amount: Medium Exudate Type: Serosanguineous Exudate Color: red, brown Foul Odor After Cleansing: No Slough/Fibrino Yes Wound Bed Granulation Amount: Large (67-100%) Exposed Structure Granulation Quality: Red Fascia Exposed: No Necrotic Amount: Small (1-33%) Fat Layer (Subcutaneous Tissue) Exposed: Yes Tendon Exposed: No Muscle Exposed: No Joint Exposed: No Bone Exposed: No Treatment Notes Wound #1 (Ankle) Wound Laterality: Right, Medial Cleanser Soap and Water Discharge Instruction: May shower and wash wound with dial antibacterial soap and water prior to dressing change. Wound Cleanser Discharge Instruction: Cleanse the wound with wound cleanser prior to applying a clean dressing using gauze sponges, not tissue or cotton balls. Peri-Wound Care Skin  Prep Discharge Instruction: Use skin prep as directed Topical Gentamicin Discharge Instruction: As directed by physician Primary Dressing Hydrofera Blue Ready Foam, 2.5 x2.5 in Discharge Instruction: Apply to wound bed as instructed Secondary Dressing Zetuvit Plus Silicone Border Dressing 5x5 (in/in) Discharge Instruction: Apply silicone border over primary dressing as directed. Secured With Compression Wrap Compression Stockings Facilities manager) Signed: 01/10/2021 4:45:49 PM By: Fonnie Mu RN Signed: 01/11/2021 1:53:08 PM By: Karl Ito Entered By: Karl Ito on 01/10/2021 14:17:24 -------------------------------------------------------------------------------- Vitals Details Patient Name: Date of Service: [name], Jeffery Munoz. 01/10/2021 3:15 PM Medical Record Number: [id number] Patient Account Number: [id number] Date of Birth/Sex: Treating RN: November 27, 1955 (65 y.o. [name] Primary Care Autumn Pruitt: Other Clinician: PCP, NO Referring Nalaysia Manganiello: Treating Alesandro Stueve/Extender: Bary Leriche in Treatment: 3 Vital Signs Time Taken: 14:12 Temperature (F): 98.3 Pulse (bpm): 86 Respiratory Rate (breaths/min): 17 Blood Pressure (mmHg): 209/119 Reference Range: 80 - 120 mg / dl Electronic Signature(s) Signed: 01/10/2021 4:45:49 PM By: Fonnie Mu RN Entered By: Fonnie Mu on 01/10/2021 14:13:25

## 2021-01-17 ENCOUNTER — Encounter (HOSPITAL_BASED_OUTPATIENT_CLINIC_OR_DEPARTMENT_OTHER): Payer: Medicare Other | Admitting: Internal Medicine

## 2021-01-31 ENCOUNTER — Encounter (HOSPITAL_BASED_OUTPATIENT_CLINIC_OR_DEPARTMENT_OTHER): Payer: Medicare Other | Admitting: Internal Medicine

## 2021-02-02 ENCOUNTER — Encounter (HOSPITAL_BASED_OUTPATIENT_CLINIC_OR_DEPARTMENT_OTHER): Payer: Medicare Other | Admitting: Internal Medicine

## 2021-11-11 IMAGING — DX DG CHEST 1V PORT
1 series · 1 of 1 positions shown · non-contrast
Comparison: July 28, 2017

CLINICAL DATA: Weakness and cough.

EXAM:
PORTABLE CHEST 1 VIEW

[chest ap]
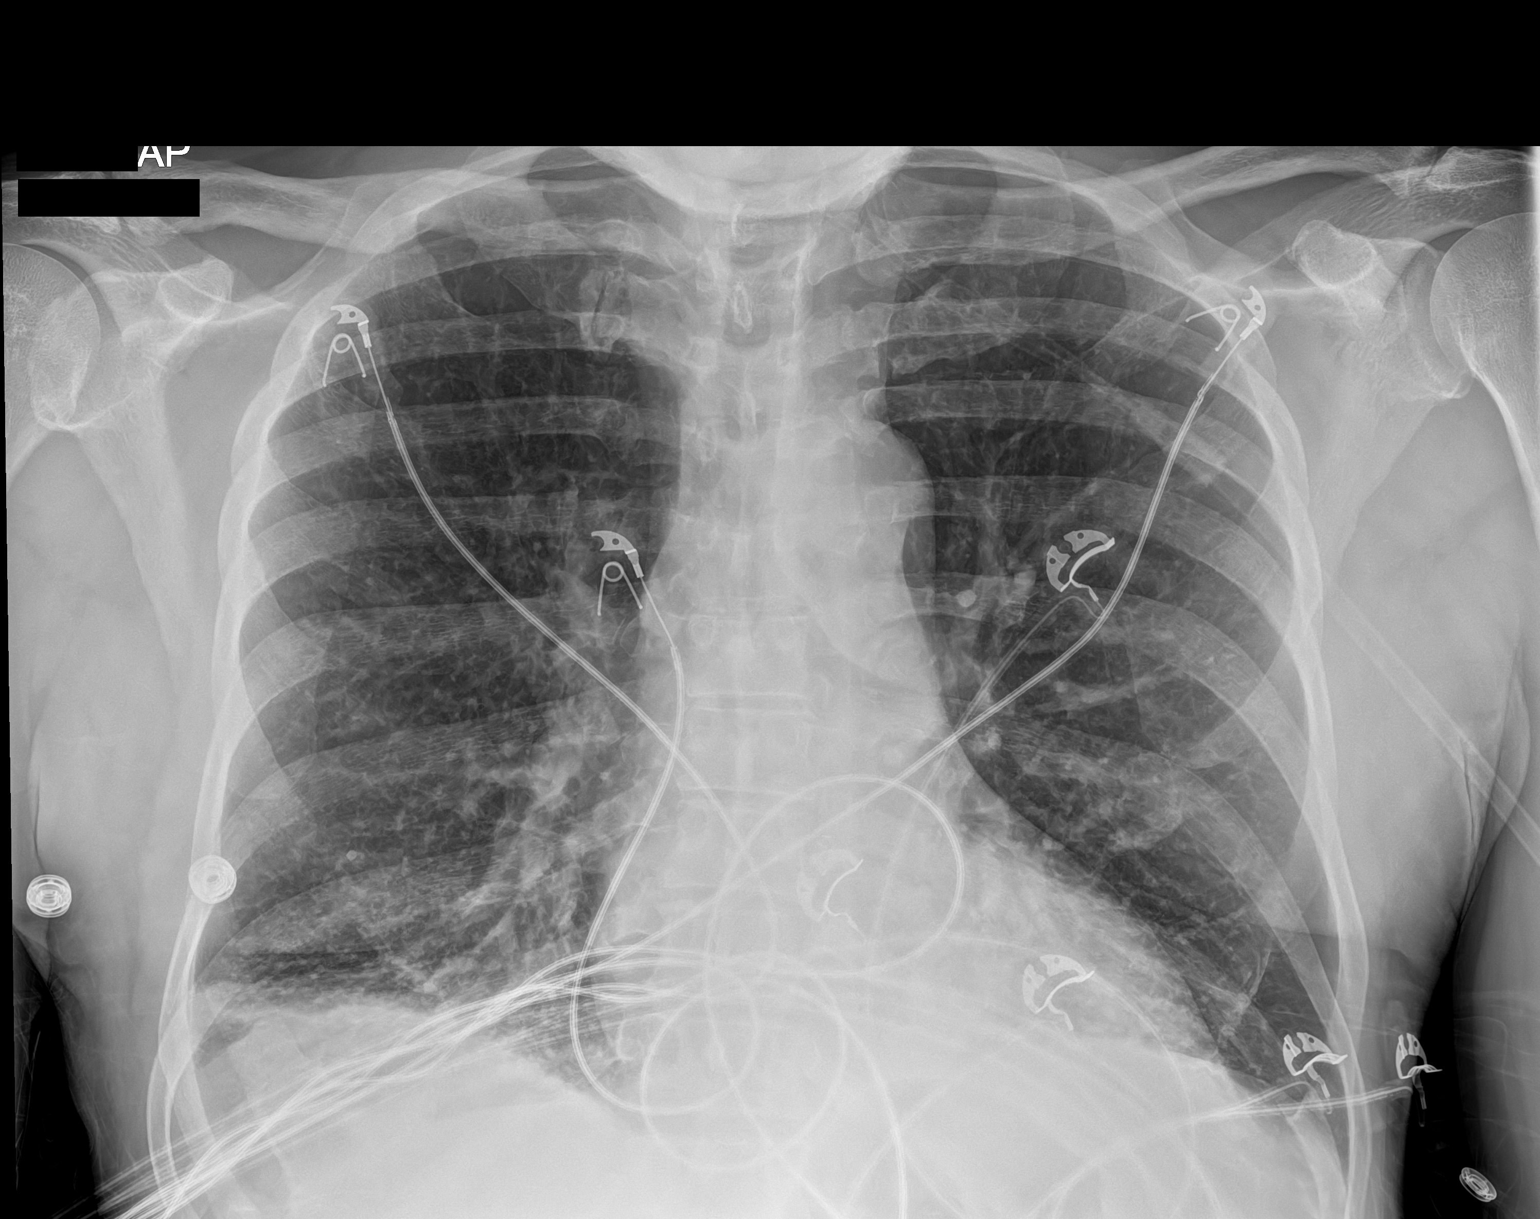

[1 of 1 positions shown; findings below may reference images not displayed]

FINDINGS: Mild areas of atelectasis and/or infiltrate are seen within the
bilateral lung bases. There is no evidence of a pleural effusion or
pneumothorax. The heart size and mediastinal contours are within
normal limits. The visualized skeletal structures are unremarkable.
IMPRESSION: Mild bibasilar atelectasis and/or infiltrate.

## 2021-11-24 ENCOUNTER — Encounter (HOSPITAL_COMMUNITY): Payer: Self-pay

## 2021-11-24 ENCOUNTER — Emergency Department (HOSPITAL_COMMUNITY): Payer: Medicare Other

## 2021-11-24 ENCOUNTER — Emergency Department (HOSPITAL_COMMUNITY)
Admission: EM | Admit: 2021-11-24 | Discharge: 2021-11-25 | Disposition: A | Discharge status: Home / Self Care | Payer: Medicare Other | Attending: Emergency Medicine | Admitting: Emergency Medicine

## 2021-11-24 ENCOUNTER — Other Ambulatory Visit: Payer: Self-pay

## 2021-11-24 DIAGNOSIS — I509 Heart failure, unspecified: Secondary | ICD-10-CM | POA: Diagnosis not present

## 2021-11-24 DIAGNOSIS — I2510 Atherosclerotic heart disease of native coronary artery without angina pectoris: Secondary | ICD-10-CM | POA: Insufficient documentation

## 2021-11-24 DIAGNOSIS — F17210 Nicotine dependence, cigarettes, uncomplicated: Secondary | ICD-10-CM | POA: Insufficient documentation

## 2021-11-24 DIAGNOSIS — Y92410 Unspecified street and highway as the place of occurrence of the external cause: Secondary | ICD-10-CM | POA: Diagnosis not present

## 2021-11-24 DIAGNOSIS — M545 Low back pain, unspecified: Secondary | ICD-10-CM | POA: Insufficient documentation

## 2021-11-24 DIAGNOSIS — I110 Hypertensive heart disease with heart failure: Secondary | ICD-10-CM | POA: Diagnosis not present

## 2021-11-24 MED ORDER — METHOCARBAMOL 500 MG PO TABS: 500.0000 mg | ORAL_TABLET | Freq: Three times a day (TID) | ORAL | 0 refills | Status: DC | PRN

## 2021-11-24 NOTE — Discharge Instructions (Addendum)
You were evaluated in the Emergency Department and after careful evaluation, we did not find any emergent condition requiring admission or further testing in the hospital.  Your exam/testing today was overall reassuring.  Recommend Tylenol or Motrin at home for discomfort.  Can use the Robaxin for more significant pain.  Please return to the Emergency Department if you experience any worsening of your condition.  Thank you for allowing [place] to be a part of your care.
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
## 2022-04-05 ENCOUNTER — Emergency Department (HOSPITAL_COMMUNITY): Payer: 59

## 2022-04-05 ENCOUNTER — Other Ambulatory Visit: Payer: Self-pay

## 2022-04-05 ENCOUNTER — Encounter (HOSPITAL_COMMUNITY): Payer: Self-pay | Admitting: *Deleted

## 2022-04-05 ENCOUNTER — Emergency Department (HOSPITAL_COMMUNITY)
Admission: EM | Admit: 2022-04-05 | Discharge: 2022-04-05 | Disposition: A | Discharge status: Home / Self Care | Payer: 59 | Attending: Emergency Medicine | Admitting: Emergency Medicine

## 2022-04-05 DIAGNOSIS — Z79899 Other long term (current) drug therapy: Secondary | ICD-10-CM | POA: Insufficient documentation

## 2022-04-05 DIAGNOSIS — J90 Pleural effusion, not elsewhere classified: Secondary | ICD-10-CM

## 2022-04-05 DIAGNOSIS — M79605 Pain in left leg: Secondary | ICD-10-CM | POA: Diagnosis not present

## 2022-04-05 DIAGNOSIS — S0990XA Unspecified injury of head, initial encounter: Secondary | ICD-10-CM | POA: Insufficient documentation

## 2022-04-05 DIAGNOSIS — W19XXXA Unspecified fall, initial encounter: Secondary | ICD-10-CM

## 2022-04-05 DIAGNOSIS — R10819 Abdominal tenderness, unspecified site: Secondary | ICD-10-CM | POA: Insufficient documentation

## 2022-04-05 DIAGNOSIS — F1490 Cocaine use, unspecified, uncomplicated: Secondary | ICD-10-CM | POA: Diagnosis not present

## 2022-04-05 DIAGNOSIS — F10129 Alcohol abuse with intoxication, unspecified: Secondary | ICD-10-CM | POA: Diagnosis present

## 2022-04-05 LAB — COMPREHENSIVE METABOLIC PANEL
ALT: 10 U/L (ref 0–44)
AST: 16 U/L (ref 15–41)
Albumin: 3.9 g/dL (ref 3.5–5.0)
Alkaline Phosphatase: 91 U/L (ref 38–126)
Anion gap: 10 (ref 5–15)
BUN: 10 mg/dL (ref 8–23)
CO2: 25 mmol/L (ref 22–32)
Calcium: 8.8 mg/dL — ABNORMAL LOW (ref 8.9–10.3)
Chloride: 101 mmol/L (ref 98–111)
Creatinine, Ser: 1.11 mg/dL (ref 0.61–1.24)
GFR, Estimated: >60 mL/min (ref >60)
Glucose, Bld: 104 mg/dL — ABNORMAL HIGH (ref 70–99)
Potassium: 3.8 mmol/L (ref 3.5–5.1)
Sodium: 136 mmol/L (ref 135–145)
Total Bilirubin: 0.5 mg/dL (ref 0.3–1.2)
Total Protein: 7.4 g/dL (ref 6.5–8.1)

## 2022-04-05 LAB — CBC
HCT: 44.7 % (ref 39.0–52.0)
Hemoglobin: 14.5 g/dL (ref 13.0–17.0)
MCH: 30.3 pg (ref 26.0–34.0)
MCHC: 32.4 g/dL (ref 30.0–36.0)
MCV: 93.3 fL (ref 80.0–100.0)
Platelets: 276 10*3/uL (ref 150–400)
RBC: 4.79 MIL/uL (ref 4.22–5.81)
RDW: 12.9 % (ref 11.5–15.5)
WBC: 5.8 10*3/uL (ref 4.0–10.5)
nRBC: 0 % (ref 0.0–0.2)

## 2022-04-05 LAB — RAPID URINE DRUG SCREEN, HOSP PERFORMED
Amphetamines: NOT DETECTED
Barbiturates: NOT DETECTED
Benzodiazepines: NOT DETECTED
Cocaine: POSITIVE — AB
Opiates: NOT DETECTED
Tetrahydrocannabinol: NOT DETECTED

## 2022-04-05 LAB — ETHANOL: Alcohol, Ethyl (B): 70 mg/dL — ABNORMAL HIGH (ref <10)

## 2022-04-05 LAB — TROPONIN I (HIGH SENSITIVITY)
Troponin I (High Sensitivity): 18 ng/L — ABNORMAL HIGH (ref <18)
Troponin I (High Sensitivity): 20 ng/L — ABNORMAL HIGH (ref <18)

## 2022-04-05 MED ORDER — IOHEXOL 300 MG/ML  SOLN
100.0000 mL | Freq: Once | INTRAMUSCULAR | Status: AC | PRN
  Administered 2022-04-05: 100 mL via INTRAVENOUS

## 2022-04-05 NOTE — ED Notes (Signed)
Pt given blanket. Family member at bedside

## 2022-04-05 NOTE — ED Provider Notes (Signed)
[facility] Provider Note   CSN: [id number] Arrival date & time: 04/05/22  1628     History  Chief Complaint  Patient presents with   Alcohol Intoxication    Jeffery Munoz is a 67 y.o. male.   Alcohol Intoxication  Patient brought in by EMS.  Reportedly called for a fall complaining of left leg pain.  However patient will not speak to me.  Moves all extremities.  Nurse reported alcohol abuse but patient will shake his head no when asked if he has been drinking.  Denies other drug use.  States he just hurts in the leg.  Points down towards the ankle.  Keeps his eyes closed.  However pupils.  Gross reactive but may be a little smaller.  Some potential tenderness over abdomen also.  Unsure about events that led to coming to the ER.     Home Medications Prior to Admission medications   Medication Sig Start Date End Date Taking? Authorizing Provider  amLODipine (NORVASC) 5 MG tablet Take 1 tablet (5 mg total) by mouth daily. 11/27/19   Barton Dubois, MD  ascorbic acid (VITAMIN C) 500 MG tablet Take 1 tablet (500 mg total) by mouth daily. 11/27/19   Barton Dubois, MD  doxycycline (VIBRAMYCIN) 100 MG capsule Take 1 capsule (100 mg total) by mouth 2 (two) times daily. 12/01/20   Vanessa Kick, MD  gentamicin cream (GARAMYCIN) 0.1 % Apply 1 application topically 2 (two) times daily. 12/12/20   Edrick Kins, DPM  guaiFENesin-dextromethorphan (ROBITUSSIN DM) 100-10 MG/5ML syrup Take 10 mLs by mouth every 8 (eight) hours as needed for cough. 11/26/19   Barton Dubois, MD  HYDROcodone-acetaminophen (NORCO/VICODIN) 5-325 MG tablet Take 1 tablet by mouth every 6 (six) hours as needed for severe pain. 12/12/20   Edrick Kins, DPM  Ipratropium-Albuterol (COMBIVENT) 20-100 MCG/ACT AERS respimat Inhale 1 puff into the lungs every 6 (six) hours as needed for wheezing or shortness of breath. 11/26/19   Barton Dubois, MD  methocarbamol (ROBAXIN) 500 MG  tablet Take 1 tablet (500 mg total) by mouth every 8 (eight) hours as needed for muscle spasms. 11/24/21   Maudie Flakes, MD  pantoprazole (PROTONIX) 40 MG tablet Take 1 tablet (40 mg total) by mouth daily. 11/26/19 11/25/20  Barton Dubois, MD  zinc sulfate 220 (50 Zn) MG capsule Take 1 capsule (220 mg total) by mouth daily. 11/27/19   Barton Dubois, MD      Allergies    Patient has no known allergies.    Review of Systems   Review of Systems  Physical Exam Updated Vital Signs BP (!) 174/101   Pulse 71   Temp 98.1 F (36.7 C) (Oral)   Resp 19   SpO2 93%  Physical Exam Vitals and nursing note reviewed.  HENT:     Head: Atraumatic.  Eyes:     Comments: Pupils reactive but may be mildly constricted.  Cardiovascular:     Rate and Rhythm: Regular rhythm.  Pulmonary:     Breath sounds: No wheezing.  Abdominal:     Tenderness: There is abdominal tenderness.     Comments: Potential diffuse tenderness.  Musculoskeletal:     Comments: No deformity seen.  Potential tenderness to left ankle but more patient complained to the physical exam findings  Neurological:     Comments: Will not yes or no.  Keeps eyes closed.  Will follow commands such as squeezing hands but will not speak  for me.     ED Results / Procedures / Treatments   Labs (all labs ordered are listed, but only abnormal results are displayed) Labs Reviewed  COMPREHENSIVE METABOLIC PANEL - Abnormal; Notable for the following components:      Result Value   Glucose, Bld 104 (*)    Calcium 8.8 (*)    All other components within normal limits  ETHANOL - Abnormal; Notable for the following components:   Alcohol, Ethyl (B) 70 (*)    All other components within normal limits  RAPID URINE DRUG SCREEN, HOSP PERFORMED - Abnormal; Notable for the following components:   Cocaine POSITIVE (*)    All other components within normal limits  TROPONIN I (HIGH SENSITIVITY) - Abnormal; Notable for the following components:    Troponin I (High Sensitivity) 18 (*)    All other components within normal limits  TROPONIN I (HIGH SENSITIVITY) - Abnormal; Notable for the following components:   Troponin I (High Sensitivity) 20 (*)    All other components within normal limits  CBC    EKG EKG Interpretation  Date/Time:  Thursday April 05 2022 16:39:27 EST Ventricular Rate:  59 PR Interval:  161 QRS Duration: 81 QT Interval:  471 QTC Calculation: 467 R Axis:   55 Text Interpretation: Sinus rhythm Probable left atrial enlargement RSR' in V1 or V2, probably normal variant LVH with secondary repolarization abnormality Anterior ST elevation, probably due to LVH T waves now downgoing  laterally and inferiorly. Confirmed by Davonna Belling [phone]) on 04/05/2022 4:47:56 PM  Radiology CT Head Wo Contrast  Result Date: 04/05/2022 CLINICAL DATA:  Alcohol intoxication, head and neck trauma EXAM: CT HEAD WITHOUT CONTRAST CT CERVICAL SPINE WITHOUT CONTRAST TECHNIQUE: Multidetector CT imaging of the head and cervical spine was performed following the standard protocol without intravenous contrast. Multiplanar CT image reconstructions of the cervical spine were also generated. RADIATION DOSE REDUCTION: This exam was performed according to the departmental dose-optimization program which includes automated exposure control, adjustment of the mA and/or kV according to patient size and/or use of iterative reconstruction technique. COMPARISON:  None Available. FINDINGS: CT HEAD FINDINGS Brain: No evidence of acute infarction, hemorrhage, hydrocephalus, extra-axial collection or mass lesion/mass effect. Periventricular white matter hypodensity. Vascular: No hyperdense vessel or unexpected calcification. Skull: Normal. Negative for fracture or focal lesion. Sinuses/Orbits: Bilateral maxillary sinus mucosal thickening with high attenuation bilateral air-fluid levels (series 5, image 29). Other: None. CT CERVICAL SPINE FINDINGS Alignment:  Straightening of the normal cervical lordosis. Skull base and vertebrae: No acute fracture. No primary bone lesion or focal pathologic process. Soft tissues and spinal canal: No prevertebral fluid or swelling. No visible canal hematoma. Disc levels: Mild multilevel disc space height loss and osteophytosis throughout the cervical spine. Upper chest: Large left pleural effusion, incompletely imaged. Other: None. IMPRESSION: 1. No acute intracranial pathology. Small-vessel white matter disease. 2. Bilateral maxillary sinus mucosal thickening with high attenuation bilateral air-fluid levels partially imaged. Correlate for sinusitis or facial trauma. 3. No fracture or static subluxation of the cervical spine. 4. Large left pleural effusion, incompletely imaged. Electronically Signed   By: Delanna Ahmadi M.D.   On: 04/05/2022 19:36   CT Cervical Spine Wo Contrast  Result Date: 04/05/2022 CLINICAL DATA:  Alcohol intoxication, head and neck trauma EXAM: CT HEAD WITHOUT CONTRAST CT CERVICAL SPINE WITHOUT CONTRAST TECHNIQUE: Multidetector CT imaging of the head and cervical spine was performed following the standard protocol without intravenous contrast. Multiplanar CT image reconstructions of the cervical spine were  also generated. RADIATION DOSE REDUCTION: This exam was performed according to the departmental dose-optimization program which includes automated exposure control, adjustment of the mA and/or kV according to patient size and/or use of iterative reconstruction technique. COMPARISON:  None Available. FINDINGS: CT HEAD FINDINGS Brain: No evidence of acute infarction, hemorrhage, hydrocephalus, extra-axial collection or mass lesion/mass effect. Periventricular white matter hypodensity. Vascular: No hyperdense vessel or unexpected calcification. Skull: Normal. Negative for fracture or focal lesion. Sinuses/Orbits: Bilateral maxillary sinus mucosal thickening with high attenuation bilateral air-fluid levels (series  5, image 29). Other: None. CT CERVICAL SPINE FINDINGS Alignment: Straightening of the normal cervical lordosis. Skull base and vertebrae: No acute fracture. No primary bone lesion or focal pathologic process. Soft tissues and spinal canal: No prevertebral fluid or swelling. No visible canal hematoma. Disc levels: Mild multilevel disc space height loss and osteophytosis throughout the cervical spine. Upper chest: Large left pleural effusion, incompletely imaged. Other: None. IMPRESSION: 1. No acute intracranial pathology. Small-vessel white matter disease. 2. Bilateral maxillary sinus mucosal thickening with high attenuation bilateral air-fluid levels partially imaged. Correlate for sinusitis or facial trauma. 3. No fracture or static subluxation of the cervical spine. 4. Large left pleural effusion, incompletely imaged. Electronically Signed   By: Jearld Lesch M.D.   On: 04/05/2022 19:36   CT CHEST ABDOMEN PELVIS W CONTRAST  Result Date: 04/05/2022 CLINICAL DATA:  Trauma. EXAM: CT CHEST, ABDOMEN, AND PELVIS WITH CONTRAST TECHNIQUE: Multidetector CT imaging of the chest, abdomen and pelvis was performed following the standard protocol during bolus administration of intravenous contrast. RADIATION DOSE REDUCTION: This exam was performed according to the departmental dose-optimization program which includes automated exposure control, adjustment of the mA and/or kV according to patient size and/or use of iterative reconstruction technique. CONTRAST:  OMNIPAQUE IOHEXOL 300 MG/ML  SOLN COMPARISON:  None Available. FINDINGS: CT CHEST FINDINGS Cardiovascular: No significant vascular findings. Normal heart size. No pericardial effusion. There are atherosclerotic calcifications of the aorta and coronary arteries. Mediastinum/Nodes: No enlarged mediastinal, hilar, or axillary lymph nodes. Thyroid gland, trachea, and esophagus demonstrate no significant findings. Lungs/Pleura: There is a moderate-to-large sized left  pleural effusion. There are patchy ground-glass opacities in the left upper lobe. There are atelectatic changes in the left lower lobe and lingula. Secretions are seen in the bilateral mainstem bronchi. There is a 2 mm nodular density in the right middle lobe image 3/118. Musculoskeletal: No chest wall mass or suspicious bone lesions identified. CT ABDOMEN PELVIS FINDINGS Hepatobiliary: No focal liver abnormality is seen. No gallstones, gallbladder wall thickening, or biliary dilatation. Pancreas: Unremarkable. No pancreatic ductal dilatation or surrounding inflammatory changes. Spleen: Normal in size without focal abnormality. Adrenals/Urinary Tract: Adrenal glands are unremarkable. Kidneys are normal, without renal calculi, focal lesion, or hydronephrosis. Bladder is unremarkable. Stomach/Bowel: Stomach is within normal limits. Appendix appears normal. No evidence of bowel wall thickening, distention, or inflammatory changes. Vascular/Lymphatic: Aortic atherosclerosis. No enlarged abdominal or pelvic lymph nodes. Reproductive: Prostate is unremarkable. Other: No abdominal wall hernia or abnormality. No abdominopelvic ascites. Musculoskeletal: No acute or significant osseous findings. IMPRESSION: 1. No acute posttraumatic sequelae in the chest, abdomen or pelvis. 2. Moderate-to-large sized left pleural effusion. 3. Patchy ground-glass opacities in the left upper lobe, likely infectious/inflammatory. 4. Secretions in the bilateral mainstem bronchi. 5. 2 mm right solid pulmonary nodule. Per Fleischner Society Guidelines, no routine follow-up imaging is recommended. These guidelines do not apply to immunocompromised patients and patients with cancer. Follow up in patients with significant comorbidities as clinically warranted.  For lung cancer screening, adhere to Lung-RADS guidelines. Reference: Radiology. 2017; 284(1):228-43. 6. Aortic Atherosclerosis (ICD10-I70.0). Electronically Signed   By: Ronney Asters M.D.    On: 04/05/2022 19:29   DG Chest Portable 1 View  Result Date: 04/05/2022 CLINICAL DATA:  Fall EXAM: PORTABLE CHEST 1 VIEW COMPARISON:  Chest x-ray dated September 21st 2021 FINDINGS: Cardiac and mediastinal contours are unchanged. Small left pleural effusion and left basilar atelectasis. Area of nodularity the lower lateral left lung base. No evidence of pneumothorax. IMPRESSION: 1. Small left pleural effusion and left basilar atelectasis. 2. Area of nodularity the lower lateral left lung base. Recommend contrast-enhanced chest CT for further evaluation. Electronically Signed   By: Yetta Glassman M.D.   On: 04/05/2022 18:15   DG Tibia/Fibula Left  Result Date: 04/05/2022 CLINICAL DATA:  Fall EXAM: LEFT TIBIA AND FIBULA - 2 VIEW COMPARISON:  None Available. FINDINGS: There is no evidence of fracture or other focal bone lesions. Vascular calcifications, soft tissues are otherwise unremarkable. IMPRESSION: No acute osseous abnormality Electronically Signed   By: Keane Police D.O.   On: 04/05/2022 18:12   DG Pelvis Portable  Result Date: 04/05/2022 CLINICAL DATA:  Fall EXAM: PORTABLE PELVIS 1-2 VIEWS COMPARISON:  None Available. FINDINGS: There is no evidence of pelvic fracture or diastasis. No pelvic bone lesions are seen. There are peripheral vascular calcifications present. There are mild degenerative changes of both hips. IMPRESSION: No evidence of acute fracture. Electronically Signed   By: Ronney Asters M.D.   On: 04/05/2022 18:12    Procedures Procedures    Medications Ordered in ED Medications  iohexol (OMNIPAQUE) 300 MG/ML solution 100 mL (100 mLs Intravenous Contrast Given 04/05/22 1913)    ED Course/ Medical Decision Making/ A&P                             Medical Decision Making Amount and/or Complexity of Data Reviewed Labs: ordered. Radiology: ordered.  Risk Prescription drug management.   Patient brought in by EMS.  Had been given the wrong names.  Will not answer  question.  Reportedly had a fall.  Will not speak to me.  Doubt stroke.  Potentially substance abuse per nurses but patient is shakes his head no to it.  Will get basic blood work.  Will get imaging of the chest pelvis and ankle to start with.  May need further imaging of head and neck and chest and abdomen.  Reviewed drug database.  Reviewed previous notes.  Does have pleural effusion on left.  Does not appear to be traumatic.  More awake now but still not very talkative.  Troponin mildly elevated but stable.  I think likely secondary to the drug use.  White count not elevated.  Needs follow-up with PCP as an outpatient.  Instructed on decreasing drug use and alcohol use.  Should be able to discharge home.        Final Clinical Impression(s) / ED Diagnoses Final diagnoses:  Fall, initial encounter  Pleural effusion  Cocaine use    Rx / DC Orders ED Discharge Orders     None         Davonna Belling, MD 04/05/22 2241

## 2022-04-05 NOTE — ED Triage Notes (Signed)
Pt brought in by [facility] for c/o left lower leg pain and alcohol intoxication  Pt giving multiple names and birthdates with ems and registration  Pt refusing to answer any questions

## 2022-04-05 NOTE — ED Notes (Signed)
Brother took pt wallet , keys, and knifes  and cigarettes home per pt ok.
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
## 2022-04-05 NOTE — Discharge Instructions (Addendum)
Your workup was overall reassuring.  You do have fluid on the left side of your lung.  Need to follow-up with your primary care doctor for further management of this.  No trauma was seen.  Your blood pressure was also elevated but may be related to everything that it been going on.
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
## 2022-11-29 ENCOUNTER — Ambulatory Visit (INDEPENDENT_AMBULATORY_CARE_PROVIDER_SITE_OTHER): Payer: 59 | Admitting: Internal Medicine

## 2022-11-29 ENCOUNTER — Encounter: Payer: Self-pay | Admitting: Internal Medicine

## 2022-11-29 VITALS — BP 137/90 | HR 72 | Ht 72.0 in | Wt 164.6 lb

## 2022-11-29 DIAGNOSIS — E559 Vitamin D deficiency, unspecified: Secondary | ICD-10-CM

## 2022-11-29 DIAGNOSIS — G309 Alzheimer's disease, unspecified: Secondary | ICD-10-CM | POA: Insufficient documentation

## 2022-11-29 DIAGNOSIS — I10 Essential (primary) hypertension: Secondary | ICD-10-CM

## 2022-11-29 DIAGNOSIS — F330 Major depressive disorder, recurrent, mild: Secondary | ICD-10-CM | POA: Diagnosis not present

## 2022-11-29 DIAGNOSIS — I25118 Atherosclerotic heart disease of native coronary artery with other forms of angina pectoris: Secondary | ICD-10-CM | POA: Diagnosis not present

## 2022-11-29 DIAGNOSIS — Z23 Encounter for immunization: Secondary | ICD-10-CM

## 2022-11-29 DIAGNOSIS — F0280 Dementia in other diseases classified elsewhere without behavioral disturbance: Secondary | ICD-10-CM | POA: Insufficient documentation

## 2022-11-29 DIAGNOSIS — Z720 Tobacco use: Secondary | ICD-10-CM

## 2022-11-29 DIAGNOSIS — G300 Alzheimer's disease with early onset: Secondary | ICD-10-CM

## 2022-11-29 DIAGNOSIS — R739 Hyperglycemia, unspecified: Secondary | ICD-10-CM

## 2022-11-29 DIAGNOSIS — F02B18 Dementia in other diseases classified elsewhere, moderate, with other behavioral disturbance: Secondary | ICD-10-CM

## 2022-11-29 DIAGNOSIS — Z1159 Encounter for screening for other viral diseases: Secondary | ICD-10-CM

## 2022-11-29 DIAGNOSIS — E782 Mixed hyperlipidemia: Secondary | ICD-10-CM

## 2022-11-29 DIAGNOSIS — J42 Unspecified chronic bronchitis: Secondary | ICD-10-CM

## 2022-11-29 MED ORDER — AMLODIPINE BESYLATE 5 MG PO TABS: 5.0000 mg | ORAL_TABLET | Freq: Every day | ORAL | 3 refills | Status: DC

## 2022-11-29 MED ORDER — ATORVASTATIN CALCIUM 10 MG PO TABS: 10.0000 mg | ORAL_TABLET | Freq: Every day | ORAL | 3 refills | Status: DC

## 2022-11-29 MED ORDER — MIRTAZAPINE 7.5 MG PO TABS: 7.5000 mg | ORAL_TABLET | Freq: Every day | ORAL | 3 refills | Status: DC

## 2022-11-29 MED ORDER — ALBUTEROL SULFATE HFA 108 (90 BASE) MCG/ACT IN AERS: 2.0000 | INHALATION_SPRAY | Freq: Four times a day (QID) | RESPIRATORY_TRACT | 1 refills | Status: DC | PRN

## 2022-11-29 MED ORDER — DONEPEZIL HCL 5 MG PO TABS: 5.0000 mg | ORAL_TABLET | Freq: Every day | ORAL | 3 refills | Status: DC

## 2022-11-29 NOTE — Progress Notes (Signed)
New Patient Office Visit  Subjective:  Patient ID: Jeffery Munoz, male    DOB: 18-Sep-1955  Age: 67 y.o. MRN: [id number]  CC:  Chief Complaint  Patient presents with   Establish Care    HPI Jeffery Munoz is a 67 y.o. male with past medical history of CAD, HTN, HLD and tobacco abuse who presents for establishing care.   He has been having memory issues  for the last few years.  His brother and brother's girlfriend have noticed these changes, but have been gradually progressing.  He is currently independent with ADLs, but is dependent for transportation and other IADLs.  No history of CVA reported. His MoCA was 16/30 today.  No episodes of delusions or hallucinations.  He has not lost in the neighborhood yet, but his brother's girlfriend reports that he has wandering spells in the neighborhood.  He has insomnia as well.  He used to take aspirin and statin for history of CAD status status post and placement.  He has lost follow-up with cardiology.  HTN: His BP was elevated today.  He used to take Azor in the past.  He currently denies any headache, dizziness, chest pain, dyspnea or palpitations.  He smokes on some days.  He reports smoking 0.5 pack/day for about 8-10 years.    Past Medical History:  Diagnosis Date   CHF (congestive heart failure) (HCC)    Childhood asthma    Coronary atherosclerosis of native coronary artery    Essential hypertension, benign    History of pneumonia    September 2015 - treated at [facility]    Hyperlipidemia    MI (myocardial infarction) (HCC)    8 years ago    Past Surgical History:  Procedure Laterality Date   APPENDECTOMY      Family History  Problem Relation Age of Onset   Heart disease Mother        Age 70-70    Social History   Socioeconomic History   Marital status: Married    Spouse name: Not on file   Number of children: Not on file   Years of education: Not on file   Highest education level: Not on file   Occupational History   Not on file  Tobacco Use   Smoking status: Every Day    Current packs/day: 0.50    Average packs/day: 0.5 packs/day for 43.7 years (21.9 ttl pk-yrs)    Types: Cigarettes    Start date: 03/03/1979   Smokeless tobacco: Never  Vaping Use   Vaping status: Never Used  Substance and Sexual Activity   Alcohol use: Yes    Comment: Occasional   Drug use: No    Comment: Prior history of cocaine and marijuana (positive UDS 2004)   Sexual activity: Not on file  Other Topics Concern   Not on file  Social History Narrative   Not on file   Social Determinants of Health   Financial Resource Strain: Not on file  Food Insecurity: Not on file  Transportation Needs: Not on file  Physical Activity: Not on file  Stress: Not on file  Social Connections: Not on file  Intimate Partner Violence: Not on file    ROS Review of Systems  Constitutional:  Negative for chills and fever.  HENT:  Negative for congestion and sore throat.   Eyes:  Negative for pain and discharge.  Respiratory:  Negative for cough and shortness of breath.   Cardiovascular:  Negative for chest pain and  palpitations.  Gastrointestinal:  Negative for diarrhea, nausea and vomiting.  Endocrine: Negative for polydipsia and polyuria.  Genitourinary:  Negative for dysuria and hematuria.  Musculoskeletal:  Negative for neck pain and neck stiffness.  Skin:  Negative for rash.  Neurological:  Negative for dizziness, weakness, numbness and headaches.  Psychiatric/Behavioral:  Positive for confusion, dysphoric mood and sleep disturbance. Negative for agitation and behavioral problems.     Objective:   Today's Vitals: BP (!) 137/90 (BP Location: Left Arm, Patient Position: Sitting, Cuff Size: Normal)   Pulse 72   Ht 6' (1.829 m)   Wt 164 lb 9.6 oz (74.7 kg)   SpO2 95%   BMI 22.32 kg/m   Physical Exam Vitals reviewed.  Constitutional:      General: He is not in acute distress.    Appearance: He is  not diaphoretic.  HENT:     Head: Normocephalic and atraumatic.     Nose: Nose normal.     Mouth/Throat:     Mouth: Mucous membranes are moist.  Eyes:     General: No scleral icterus.    Extraocular Movements: Extraocular movements intact.  Cardiovascular:     Rate and Rhythm: Normal rate and regular rhythm.     Heart sounds: Normal heart sounds. No murmur heard. Pulmonary:     Breath sounds: Normal breath sounds. No wheezing or rales.  Abdominal:     Palpations: Abdomen is soft.     Tenderness: There is no abdominal tenderness.  Musculoskeletal:     Cervical back: Neck supple. No tenderness.     Right lower leg: No edema.     Left lower leg: No edema.  Skin:    General: Skin is warm.     Findings: No rash.  Neurological:     General: No focal deficit present.     Mental Status: He is alert.     Comments: A&O to person and place  Psychiatric:        Mood and Affect: Mood normal.        Behavior: Behavior normal.     Assessment & Plan:   Problem List Items Addressed This Visit       Cardiovascular and Mediastinum   Coronary artery disease    S/p stent placement Needs to restart aspirin and statin Currently denies chest pain or dyspnea Needs to quit smoking      Relevant Medications   amLODipine (NORVASC) 5 MG tablet   atorvastatin (LIPITOR) 10 MG tablet   Essential hypertension    BP Readings from Last 1 Encounters:  11/29/22 (!) 137/90   Uncontrolled due to noncompliance Restart only amlodipine 5 mg QD for now Counseled for compliance with the medications Advised DASH diet and moderate exercise/walking, at least 150 mins/week       Relevant Medications   amLODipine (NORVASC) 5 MG tablet   atorvastatin (LIPITOR) 10 MG tablet   Other Relevant Orders   CMP14+EGFR (Completed)   CBC with Differential/Platelet (Completed)     Respiratory   Chronic bronchitis (HCC)    Has cut down smoking Albuterol as needed for dyspnea      Relevant Medications    albuterol (VENTOLIN HFA) 108 (90 Base) MCG/ACT inhaler     Nervous and Auditory   Alzheimer's dementia - Primary    Slow decline in memory MoCA: 16/30 Check CBC, CMP, TSH, B12 Likely component of vascular dementia as well considering his history of CAD Will need MRI of the brain later Started Remeron  for insomnia and to improve appetite      Relevant Medications   donepezil (ARICEPT) 5 MG tablet   mirtazapine (REMERON) 7.5 MG tablet   Other Relevant Orders   TSH (Completed)   CMP14+EGFR (Completed)   CBC with Differential/Platelet (Completed)   Hepatitis C Antibody (Completed)   B12 (Completed)   Drug Screen, Urine     Other   Mixed hyperlipidemia (Chronic)    Restart atorvastatin considering his history of CAD      Relevant Medications   amLODipine (NORVASC) 5 MG tablet   atorvastatin (LIPITOR) 10 MG tablet   Other Relevant Orders   Lipid panel (Completed)   Tobacco abuse    Smokes 2-3 cigarettes on some days  Asked about quitting: confirms that he/she currently smokes cigarettes Advise to quit smoking: Educated about QUITTING to reduce the risk of cancer, cardio and cerebrovascular disease. Assess willingness: Unwilling to quit at this time, but is working on cutting back. Assist with counseling and pharmacotherapy: Counseled and literature provided. Arrange for follow up: follow up in 3 months and continue to offer help.      Mild episode of recurrent major depressive disorder (HCC)    Flowsheet Row Office Visit from 11/29/2022 in [facility]  PHQ-9 Total Score 13      Likely component of dementia as well Started Remeron for MDD and insomnia      Relevant Medications   mirtazapine (REMERON) 7.5 MG tablet   Other Visit Diagnoses     Need for hepatitis C screening test       Relevant Orders   Hepatitis C Antibody (Completed)   Hyperglycemia       Relevant Orders   Hemoglobin A1c (Completed)   Vitamin D deficiency       Relevant  Orders   VITAMIN D 25 Hydroxy (Vit-D Deficiency, Fractures) (Completed)   Encounter for immunization       Relevant Orders   Flu Vaccine Trivalent High Dose (Fluad) (Completed)   Pneumococcal conjugate vaccine 20-valent (Completed)       Outpatient Encounter Medications as of 11/29/2022  Medication Sig   albuterol (VENTOLIN HFA) 108 (90 Base) MCG/ACT inhaler Inhale 2 puffs into the lungs every 6 (six) hours as needed for wheezing or shortness of breath.   atorvastatin (LIPITOR) 10 MG tablet Take 1 tablet (10 mg total) by mouth daily.   donepezil (ARICEPT) 5 MG tablet Take 1 tablet (5 mg total) by mouth at bedtime.   mirtazapine (REMERON) 7.5 MG tablet Take 1 tablet (7.5 mg total) by mouth at bedtime.   [DISCONTINUED] amLODipine (NORVASC) 5 MG tablet Take 1 tablet (5 mg total) by mouth daily.   [DISCONTINUED] ascorbic acid (VITAMIN C) 500 MG tablet Take 1 tablet (500 mg total) by mouth daily.   [DISCONTINUED] HYDROcodone-acetaminophen (NORCO/VICODIN) 5-325 MG tablet Take 1 tablet by mouth every 6 (six) hours as needed for severe pain.   [DISCONTINUED] Ipratropium-Albuterol (COMBIVENT) 20-100 MCG/ACT AERS respimat Inhale 1 puff into the lungs every 6 (six) hours as needed for wheezing or shortness of breath.   [DISCONTINUED] methocarbamol (ROBAXIN) 500 MG tablet Take 1 tablet (500 mg total) by mouth every 8 (eight) hours as needed for muscle spasms.   [DISCONTINUED] zinc sulfate 220 (50 Zn) MG capsule Take 1 capsule (220 mg total) by mouth daily.   amLODipine (NORVASC) 5 MG tablet Take 1 tablet (5 mg total) by mouth daily.   [DISCONTINUED] doxycycline (VIBRAMYCIN) 100 MG capsule Take 1 capsule (  100 mg total) by mouth 2 (two) times daily. (Patient not taking: Reported on 11/29/2022)   [DISCONTINUED] gentamicin cream (GARAMYCIN) 0.1 % Apply 1 application topically 2 (two) times daily. (Patient not taking: Reported on 11/29/2022)   [DISCONTINUED] guaiFENesin-dextromethorphan (ROBITUSSIN DM) 100-10  MG/5ML syrup Take 10 mLs by mouth every 8 (eight) hours as needed for cough. (Patient not taking: Reported on 11/29/2022)   [DISCONTINUED] pantoprazole (PROTONIX) 40 MG tablet Take 1 tablet (40 mg total) by mouth daily.   No facility-administered encounter medications on file as of 11/29/2022.    Follow-up: Return in about 2 months (around 01/29/2023) for HTN and dementia.   Anabel Halon, MD

## 2022-11-29 NOTE — Patient Instructions (Signed)
Please start taking Amlodipine for blood pressure. Start taking Aspirin 81 mg once daily and Atorvastatin in the evening for cholesterol and your cardiac stent history.  Please start taking Donepezil and Mirtazepine in the evening.  Please eat at regular intervals and maintain at least 64 ounces of fluid intake in a day.  Please avoid alcohol use and smoking.

## 2022-11-30 DIAGNOSIS — J42 Unspecified chronic bronchitis: Secondary | ICD-10-CM | POA: Insufficient documentation

## 2022-11-30 LAB — CBC WITH DIFFERENTIAL/PLATELET
Basophils Absolute: 0 10*3/uL (ref 0.0–0.2)
Basos: 1 %
EOS (ABSOLUTE): 0.2 10*3/uL (ref 0.0–0.4)
Eos: 3 %
Hematocrit: 44.5 % (ref 37.5–51.0)
Hemoglobin: 14.4 g/dL (ref 13.0–17.7)
Immature Grans (Abs): 0 10*3/uL (ref 0.0–0.1)
Immature Granulocytes: 0 %
Lymphocytes Absolute: 1.6 10*3/uL (ref 0.7–3.1)
Lymphs: 25 %
MCH: 30.3 pg (ref 26.6–33.0)
MCHC: 32.4 g/dL (ref 31.5–35.7)
MCV: 94 fL (ref 79–97)
Monocytes Absolute: 0.8 10*3/uL (ref 0.1–0.9)
Monocytes: 12 %
Neutrophils Absolute: 3.7 10*3/uL (ref 1.4–7.0)
Neutrophils: 59 %
Platelets: 280 10*3/uL (ref 150–450)
RBC: 4.76 x10E6/uL (ref 4.14–5.80)
RDW: 12.6 % (ref 11.6–15.4)
WBC: 6.3 10*3/uL (ref 3.4–10.8)

## 2022-11-30 LAB — LIPID PANEL
Chol/HDL Ratio: 2.9 {ratio} (ref 0.0–5.0)
Cholesterol, Total: 160 mg/dL (ref 100–199)
HDL: 56 mg/dL (ref >39)
LDL Chol Calc (NIH): 87 mg/dL (ref 0–99)
Triglycerides: 90 mg/dL (ref 0–149)
VLDL Cholesterol Cal: 17 mg/dL (ref 5–40)

## 2022-11-30 LAB — CMP14+EGFR
ALT: 14 [IU]/L (ref 0–44)
AST: 25 [IU]/L (ref 0–40)
Albumin: 4.1 g/dL (ref 3.9–4.9)
Alkaline Phosphatase: 96 [IU]/L (ref 44–121)
BUN/Creatinine Ratio: 17 (ref 10–24)
BUN: 25 mg/dL (ref 8–27)
Bilirubin Total: 0.4 mg/dL (ref 0.0–1.2)
CO2: 25 mmol/L (ref 20–29)
Calcium: 9.3 mg/dL (ref 8.6–10.2)
Chloride: 103 mmol/L (ref 96–106)
Creatinine, Ser: 1.45 mg/dL — ABNORMAL HIGH (ref 0.76–1.27)
Globulin, Total: 2.7 g/dL (ref 1.5–4.5)
Glucose: 100 mg/dL — ABNORMAL HIGH (ref 70–99)
Potassium: 4.7 mmol/L (ref 3.5–5.2)
Sodium: 141 mmol/L (ref 134–144)
Total Protein: 6.8 g/dL (ref 6.0–8.5)
eGFR: 53 mL/min/{1.73_m2} — ABNORMAL LOW (ref >59)

## 2022-11-30 LAB — HEMOGLOBIN A1C
Est. average glucose Bld gHb Est-mCnc: 120 mg/dL
Hgb A1c MFr Bld: 5.8 % — ABNORMAL HIGH (ref 4.8–5.6)

## 2022-11-30 LAB — HEPATITIS C ANTIBODY: Hep C Virus Ab: NONREACTIVE

## 2022-11-30 LAB — TSH: TSH: 2.44 u[IU]/mL (ref 0.450–4.500)

## 2022-11-30 LAB — VITAMIN D 25 HYDROXY (VIT D DEFICIENCY, FRACTURES): Vit D, 25-Hydroxy: 20.3 ng/mL — ABNORMAL LOW (ref 30.0–100.0)

## 2022-11-30 LAB — VITAMIN B12: Vitamin B-12: 386 pg/mL (ref 232–1245)

## 2022-11-30 NOTE — Assessment & Plan Note (Signed)
Has cut down smoking Albuterol as needed for dyspnea

## 2022-11-30 NOTE — Assessment & Plan Note (Addendum)
Slow decline in memory MoCA: 16/30 Check CBC, CMP, TSH, B12 Likely component of vascular dementia as well considering his history of CAD Will need MRI of the brain later Started Remeron for insomnia and to improve appetite

## 2022-11-30 NOTE — Assessment & Plan Note (Signed)
Restart atorvastatin considering his history of CAD

## 2022-11-30 NOTE — Assessment & Plan Note (Signed)
Flowsheet Row Office Visit from 11/29/2022 in [facility]  PHQ-9 Total Score 13      Likely component of dementia as well Started Remeron for MDD and insomnia

## 2022-11-30 NOTE — Assessment & Plan Note (Addendum)
Smokes 2-3 cigarettes on some days  Asked about quitting: confirms that he/she currently smokes cigarettes Advise to quit smoking: Educated about QUITTING to reduce the risk of cancer, cardio and cerebrovascular disease. Assess willingness: Unwilling to quit at this time, but is working on cutting back. Assist with counseling and pharmacotherapy: Counseled and literature provided. Arrange for follow up: follow up in 3 months and continue to offer help.

## 2022-11-30 NOTE — Assessment & Plan Note (Signed)
S/p stent placement Needs to restart aspirin and statin Currently denies chest pain or dyspnea Needs to quit smoking

## 2022-11-30 NOTE — Assessment & Plan Note (Signed)
BP Readings from Last 1 Encounters:  11/29/22 (!) 137/90   Uncontrolled due to noncompliance Restart only amlodipine 5 mg QD for now Counseled for compliance with the medications Advised DASH diet and moderate exercise/walking, at least 150 mins/week

## 2022-12-01 LAB — DRUG SCREEN, URINE
Amphetamines, Urine: NEGATIVE ng/mL
Barbiturate screen, urine: NEGATIVE ng/mL
Benzodiazepine Quant, Ur: NEGATIVE ng/mL
Cannabinoid Quant, Ur: NEGATIVE ng/mL
Cocaine (Metab.): POSITIVE ng/mL — AB
Opiate Quant, Ur: NEGATIVE ng/mL
PCP Quant, Ur: NEGATIVE ng/mL

## 2023-01-04 ENCOUNTER — Telehealth: Payer: Self-pay | Admitting: Internal Medicine

## 2023-01-04 NOTE — Telephone Encounter (Signed)
PCS forms  Noted Copied Sleeved (put in provider box)  Call [phone] when ready for pick up

## 2023-01-08 NOTE — Telephone Encounter (Signed)
Called patient will pick up forms this morning

## 2023-01-22 ENCOUNTER — Other Ambulatory Visit: Payer: Self-pay | Admitting: Internal Medicine

## 2023-01-22 DIAGNOSIS — I25118 Atherosclerotic heart disease of native coronary artery with other forms of angina pectoris: Secondary | ICD-10-CM

## 2023-01-22 DIAGNOSIS — E782 Mixed hyperlipidemia: Secondary | ICD-10-CM

## 2023-01-28 ENCOUNTER — Encounter: Payer: Self-pay | Admitting: Internal Medicine

## 2023-01-28 ENCOUNTER — Ambulatory Visit (INDEPENDENT_AMBULATORY_CARE_PROVIDER_SITE_OTHER): Payer: 59 | Admitting: Internal Medicine

## 2023-01-28 VITALS — BP 124/60 | HR 72 | Ht 72.0 in | Wt 172.8 lb

## 2023-01-28 DIAGNOSIS — I10 Essential (primary) hypertension: Secondary | ICD-10-CM | POA: Diagnosis not present

## 2023-01-28 DIAGNOSIS — G300 Alzheimer's disease with early onset: Secondary | ICD-10-CM

## 2023-01-28 DIAGNOSIS — R7989 Other specified abnormal findings of blood chemistry: Secondary | ICD-10-CM | POA: Diagnosis not present

## 2023-01-28 DIAGNOSIS — F02818 Dementia in other diseases classified elsewhere, unspecified severity, with other behavioral disturbance: Secondary | ICD-10-CM | POA: Insufficient documentation

## 2023-01-28 DIAGNOSIS — I25118 Atherosclerotic heart disease of native coronary artery with other forms of angina pectoris: Secondary | ICD-10-CM | POA: Diagnosis not present

## 2023-01-28 DIAGNOSIS — E782 Mixed hyperlipidemia: Secondary | ICD-10-CM

## 2023-01-28 DIAGNOSIS — F02C18 Dementia in other diseases classified elsewhere, severe, with other behavioral disturbance: Secondary | ICD-10-CM

## 2023-01-28 DIAGNOSIS — F330 Major depressive disorder, recurrent, mild: Secondary | ICD-10-CM

## 2023-01-28 DIAGNOSIS — F1410 Cocaine abuse, uncomplicated: Secondary | ICD-10-CM

## 2023-01-28 DIAGNOSIS — J42 Unspecified chronic bronchitis: Secondary | ICD-10-CM | POA: Diagnosis not present

## 2023-01-28 DIAGNOSIS — Z1211 Encounter for screening for malignant neoplasm of colon: Secondary | ICD-10-CM

## 2023-01-28 MED ORDER — AMLODIPINE BESYLATE 5 MG PO TABS: 5.0000 mg | ORAL_TABLET | Freq: Every day | ORAL | 3 refills | Status: DC

## 2023-01-28 MED ORDER — DONEPEZIL HCL 5 MG PO TABS: 5.0000 mg | ORAL_TABLET | Freq: Every day | ORAL | 3 refills | Status: DC

## 2023-01-28 MED ORDER — MIRTAZAPINE 7.5 MG PO TABS: 7.5000 mg | ORAL_TABLET | Freq: Every day | ORAL | 3 refills | Status: DC

## 2023-01-28 NOTE — Assessment & Plan Note (Signed)
S/p stent placement On aspirin and statin Currently denies chest pain or dyspnea Needs to quit smoking

## 2023-01-28 NOTE — Assessment & Plan Note (Signed)
Last CMP showed elevated creatinine, unclear if AKI versus CKD Advised to maintain adequate hydration Recheck BMP

## 2023-01-28 NOTE — Assessment & Plan Note (Signed)
Restarted atorvastatin considering his history of CAD

## 2023-01-28 NOTE — Progress Notes (Signed)
New Patient Office Visit  Subjective:  Patient ID: Jeffery Munoz, male    DOB: 1955-06-25  Age: 67 y.o. MRN: [id number]  CC:  Chief Complaint  Patient presents with   Hypertension    Two month follow up    Dementia    Two month follow up    HPI Jeffery Munoz is a 67 y.o. male with past medical history of CAD, HTN, HLD and tobacco abuse who presents for f/u of his chronic medical conditions.  He has been having memory issues for the last few years.  His brother and brother's girlfriend have noticed these changes, but have been gradually progressing.  He is currently independent with ADLs, but is dependent for transportation and other IADLs.  No history of CVA reported. His MoCA was 16/30 in the last visit. ToxAssure showed cocaine X 2 (02/24, 09/24).  He admits to snorting cocaine at times, that his friends share with him.  After discussion, he agrees to stop using illicit drugs.  No episodes of delusions or hallucinations.  He has not been lost in the neighborhood yet, but his brother's girlfriend reports that he has wandering spells in the neighborhood.  He is tolerating Aricept well now.  He has insomnia as well, which has improved with mirtazapine.  He has started taking aspirin and statin for history of CAD status status post stent placement.  He has lost follow-up with cardiology.  HTN: His BP was WNL today.  He is taking amlodipine 5 mg QD now.  He currently denies any headache, dizziness, chest pain, dyspnea or palpitations.  He smokes on some days.  He reports smoking 0.5 pack/day for about 8-10 years.    Past Medical History:  Diagnosis Date   CHF (congestive heart failure) (HCC)    Childhood asthma    Coronary atherosclerosis of native coronary artery    Essential hypertension, benign    History of pneumonia    September 2015 - treated at [facility]    Hyperlipidemia    MI (myocardial infarction) (HCC)    8 years ago    Past Surgical History:  Procedure  Laterality Date   APPENDECTOMY      Family History  Problem Relation Age of Onset   Heart disease Mother        Age 75-70    Social History   Socioeconomic History   Marital status: Married    Spouse name: Not on file   Number of children: Not on file   Years of education: Not on file   Highest education level: Not on file  Occupational History   Not on file  Tobacco Use   Smoking status: Every Day    Current packs/day: 0.50    Average packs/day: 0.5 packs/day for 43.9 years (22.0 ttl pk-yrs)    Types: Cigarettes    Start date: 03/03/1979   Smokeless tobacco: Never  Vaping Use   Vaping status: Never Used  Substance and Sexual Activity   Alcohol use: Yes    Comment: Occasional   Drug use: No    Comment: Prior history of cocaine and marijuana (positive UDS 2004)   Sexual activity: Not on file  Other Topics Concern   Not on file  Social History Narrative   Not on file   Social Determinants of Health   Financial Resource Strain: Not on file  Food Insecurity: Not on file  Transportation Needs: Not on file  Physical Activity: Not on file  Stress: Not on  file  Social Connections: Not on file  Intimate Partner Violence: Not on file    ROS Review of Systems  Constitutional:  Negative for chills and fever.  HENT:  Negative for congestion and sore throat.   Eyes:  Negative for pain and discharge.  Respiratory:  Negative for cough and shortness of breath.   Cardiovascular:  Negative for chest pain and palpitations.  Gastrointestinal:  Negative for diarrhea, nausea and vomiting.  Endocrine: Negative for polydipsia and polyuria.  Genitourinary:  Negative for dysuria and hematuria.  Musculoskeletal:  Negative for neck pain and neck stiffness.  Skin:  Negative for rash.  Neurological:  Negative for dizziness, weakness, numbness and headaches.  Psychiatric/Behavioral:  Positive for confusion and sleep disturbance. Negative for agitation and behavioral problems.      Objective:   Today's Vitals: BP 124/60 (BP Location: Left Arm)   Pulse 72   Ht 6' (1.829 m)   Wt 172 lb 12.8 oz (78.4 kg)   SpO2 96%   BMI 23.44 kg/m   Physical Exam Vitals reviewed.  Constitutional:      General: He is not in acute distress.    Appearance: He is not diaphoretic.  HENT:     Head: Normocephalic and atraumatic.     Nose: Nose normal.     Mouth/Throat:     Mouth: Mucous membranes are moist.  Eyes:     General: No scleral icterus.    Extraocular Movements: Extraocular movements intact.  Cardiovascular:     Rate and Rhythm: Normal rate and regular rhythm.     Heart sounds: Normal heart sounds. No murmur heard. Pulmonary:     Breath sounds: Normal breath sounds. No wheezing or rales.  Musculoskeletal:     Cervical back: Neck supple. No tenderness.     Right lower leg: No edema.     Left lower leg: No edema.  Skin:    General: Skin is warm.     Findings: No rash.  Neurological:     General: No focal deficit present.     Mental Status: He is alert.     Comments: A&O to person and place  Psychiatric:        Mood and Affect: Mood normal. Affect is flat.        Behavior: Behavior normal.     Assessment & Plan:   Problem List Items Addressed This Visit       Cardiovascular and Mediastinum   Coronary artery disease    S/p stent placement On aspirin and statin Currently denies chest pain or dyspnea Needs to quit smoking      Relevant Medications   amLODipine (NORVASC) 5 MG tablet   aspirin EC 81 MG tablet   Essential hypertension    BP Readings from Last 1 Encounters:  01/28/23 124/60   Well-controlled with Amlodipine 5 mg QD Counseled for compliance with the medications Advised DASH diet and moderate exercise/walking, at least 150 mins/week       Relevant Medications   amLODipine (NORVASC) 5 MG tablet   aspirin EC 81 MG tablet   Other Relevant Orders   Basic Metabolic Panel (BMET)     Respiratory   Chronic bronchitis (HCC)     Has cut down smoking Albuterol as needed for dyspnea        Nervous and Auditory   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)    Slow decline in memory MoCA: 16/30 Checked CBC, CMP, TSH, B12 Likely component of vascular  dementia as well considering his history of CAD On donepezil 5 mg QD Check MRI of the brain Had started Remeron for insomnia and to improve appetite - has gained about 8 lbs since the last visit, family also reports improvement in sleep and behavior Needs to avoid substance abuse - cocaine in Urine drug screen      Relevant Medications   mirtazapine (REMERON) 7.5 MG tablet   donepezil (ARICEPT) 5 MG tablet   Other Relevant Orders   Basic Metabolic Panel (BMET)   MR Brain Wo Contrast   RESOLVED: Alzheimer's dementia - Primary   Relevant Medications   mirtazapine (REMERON) 7.5 MG tablet   donepezil (ARICEPT) 5 MG tablet     Other   Mixed hyperlipidemia (Chronic)    Restarted atorvastatin considering his history of CAD      Relevant Medications   amLODipine (NORVASC) 5 MG tablet   aspirin EC 81 MG tablet   Mild episode of recurrent major depressive disorder (HCC)    Flowsheet Row Office Visit from 01/28/2023 in [facility]  PHQ-9 Total Score 2      Likely component of dementia as well Had started Remeron for MDD and insomnia - now better      Relevant Medications   mirtazapine (REMERON) 7.5 MG tablet   Elevated serum creatinine    Last CMP showed elevated creatinine, unclear if AKI versus CKD Advised to maintain adequate hydration Recheck BMP      Relevant Orders   Basic Metabolic Panel (BMET)   Cocaine abuse (HCC)    Strictly advised to avoid illicit drug use Has history of CAD as well His memory deficit can also be due to substance abuse      Other Visit Diagnoses     Colon cancer screening       Relevant Orders   Ambulatory referral to Gastroenterology        Outpatient Encounter  Medications as of 01/28/2023  Medication Sig   aspirin EC 81 MG tablet Take 81 mg by mouth daily. Swallow whole.   albuterol (VENTOLIN HFA) 108 (90 Base) MCG/ACT inhaler Inhale 2 puffs into the lungs every 6 (six) hours as needed for wheezing or shortness of breath.   amLODipine (NORVASC) 5 MG tablet Take 1 tablet (5 mg total) by mouth daily.   atorvastatin (LIPITOR) 10 MG tablet Take 1 tablet (10 mg total) by mouth daily.   donepezil (ARICEPT) 5 MG tablet Take 1 tablet (5 mg total) by mouth at bedtime.   mirtazapine (REMERON) 7.5 MG tablet Take 1 tablet (7.5 mg total) by mouth at bedtime.   [DISCONTINUED] amLODipine (NORVASC) 5 MG tablet Take 1 tablet (5 mg total) by mouth daily.   [DISCONTINUED] donepezil (ARICEPT) 5 MG tablet Take 1 tablet (5 mg total) by mouth at bedtime.   [DISCONTINUED] mirtazapine (REMERON) 7.5 MG tablet Take 1 tablet (7.5 mg total) by mouth at bedtime.   No facility-administered encounter medications on file as of 01/28/2023.    Follow-up: Return in about 4 months (around 05/28/2023) for HTN and dementia.   Anabel Halon, MD

## 2023-01-28 NOTE — Assessment & Plan Note (Signed)
Strictly advised to avoid illicit drug use Has history of CAD as well His memory deficit can also be due to substance abuse

## 2023-01-28 NOTE — Assessment & Plan Note (Signed)
Has cut down smoking Albuterol as needed for dyspnea

## 2023-01-28 NOTE — Patient Instructions (Addendum)
Please start taking Vitamin D 2000 IU once daily.  Please continue to take medications as prescribed.  Please continue to follow low salt diet and perform moderate exercise/walking at least 150 mins/week.  Please consider getting Shingrix and Tdap vaccine at local pharmacy.

## 2023-01-28 NOTE — Assessment & Plan Note (Addendum)
Slow decline in memory MoCA: 16/30 Checked CBC, CMP, TSH, B12 Likely component of vascular dementia as well considering his history of CAD On donepezil 5 mg QD Check MRI of the brain Had started Remeron for insomnia and to improve appetite - has gained about 8 lbs since the last visit, family also reports improvement in sleep and behavior Needs to avoid substance abuse - cocaine in Urine drug screen

## 2023-01-28 NOTE — Assessment & Plan Note (Addendum)
BP Readings from Last 1 Encounters:  01/28/23 124/60   Well-controlled with Amlodipine 5 mg QD Counseled for compliance with the medications Advised DASH diet and moderate exercise/walking, at least 150 mins/week

## 2023-01-28 NOTE — Assessment & Plan Note (Addendum)
Flowsheet Row Office Visit from 01/28/2023 in [facility]  PHQ-9 Total Score 2      Likely component of dementia as well Had started Remeron for MDD and insomnia - now better

## 2023-01-29 ENCOUNTER — Encounter (INDEPENDENT_AMBULATORY_CARE_PROVIDER_SITE_OTHER): Payer: Self-pay | Admitting: *Deleted

## 2023-01-29 ENCOUNTER — Telehealth: Payer: Self-pay

## 2023-01-29 LAB — BASIC METABOLIC PANEL
BUN/Creatinine Ratio: 17 (ref 10–24)
BUN: 22 mg/dL (ref 8–27)
CO2: 26 mmol/L (ref 20–29)
Calcium: 9.6 mg/dL (ref 8.6–10.2)
Chloride: 107 mmol/L — ABNORMAL HIGH (ref 96–106)
Creatinine, Ser: 1.31 mg/dL — ABNORMAL HIGH (ref 0.76–1.27)
Glucose: 101 mg/dL — ABNORMAL HIGH (ref 70–99)
Potassium: 4.3 mmol/L (ref 3.5–5.2)
Sodium: 146 mmol/L — ABNORMAL HIGH (ref 134–144)
eGFR: 60 mL/min/{1.73_m2} (ref >59)

## 2023-01-29 NOTE — Telephone Encounter (Signed)
Left message

## 2023-01-29 NOTE — Telephone Encounter (Signed)
Copied from CRM [id number]. Topic: Clinical - Lab/Test Results >> Jan 29, 2023 10:41 AM Herbert Seta B wrote: Reason for CRM: Patient returning phone call for lab results.

## 2023-02-06 ENCOUNTER — Ambulatory Visit (HOSPITAL_COMMUNITY)
Admission: RE | Admit: 2023-02-06 | Discharge: 2023-02-06 | Disposition: A | Discharge status: Home / Self Care | Payer: 59 | Source: Ambulatory Visit | Attending: Internal Medicine | Admitting: Internal Medicine

## 2023-02-06 DIAGNOSIS — G300 Alzheimer's disease with early onset: Secondary | ICD-10-CM | POA: Diagnosis not present

## 2023-02-06 DIAGNOSIS — R413 Other amnesia: Secondary | ICD-10-CM | POA: Diagnosis not present

## 2023-02-06 DIAGNOSIS — F02818 Dementia in other diseases classified elsewhere, unspecified severity, with other behavioral disturbance: Secondary | ICD-10-CM | POA: Insufficient documentation

## 2023-02-06 DIAGNOSIS — R9089 Other abnormal findings on diagnostic imaging of central nervous system: Secondary | ICD-10-CM | POA: Diagnosis not present

## 2023-05-28 ENCOUNTER — Ambulatory Visit: Payer: 59 | Admitting: Internal Medicine

## 2023-06-11 ENCOUNTER — Other Ambulatory Visit: Payer: Self-pay | Admitting: Internal Medicine

## 2023-06-11 DIAGNOSIS — I10 Essential (primary) hypertension: Secondary | ICD-10-CM

## 2023-06-12 ENCOUNTER — Telehealth (INDEPENDENT_AMBULATORY_CARE_PROVIDER_SITE_OTHER): Payer: Self-pay | Admitting: Gastroenterology

## 2023-06-12 ENCOUNTER — Encounter: Payer: Self-pay | Admitting: Internal Medicine

## 2023-06-12 ENCOUNTER — Ambulatory Visit (INDEPENDENT_AMBULATORY_CARE_PROVIDER_SITE_OTHER): Admitting: Internal Medicine

## 2023-06-12 VITALS — BP 138/78 | HR 70 | Ht 72.0 in | Wt 178.0 lb

## 2023-06-12 DIAGNOSIS — F330 Major depressive disorder, recurrent, mild: Secondary | ICD-10-CM | POA: Diagnosis not present

## 2023-06-12 DIAGNOSIS — Z720 Tobacco use: Secondary | ICD-10-CM

## 2023-06-12 DIAGNOSIS — G300 Alzheimer's disease with early onset: Secondary | ICD-10-CM

## 2023-06-12 DIAGNOSIS — F02818 Dementia in other diseases classified elsewhere, unspecified severity, with other behavioral disturbance: Secondary | ICD-10-CM

## 2023-06-12 DIAGNOSIS — E782 Mixed hyperlipidemia: Secondary | ICD-10-CM

## 2023-06-12 DIAGNOSIS — F1410 Cocaine abuse, uncomplicated: Secondary | ICD-10-CM

## 2023-06-12 DIAGNOSIS — I10 Essential (primary) hypertension: Secondary | ICD-10-CM | POA: Diagnosis not present

## 2023-06-12 MED ORDER — MIRTAZAPINE 7.5 MG PO TABS: 7.5000 mg | ORAL_TABLET | Freq: Every day | ORAL | 1 refills | Status: DC

## 2023-06-12 MED ORDER — DONEPEZIL HCL 5 MG PO TABS: 5.0000 mg | ORAL_TABLET | Freq: Every day | ORAL | 3 refills | Status: DC

## 2023-06-12 NOTE — Patient Instructions (Addendum)
 Please schedule Medicare Annual Wellness.  Please continue to take medications as prescribed.  Please continue to follow low salt diet and perform moderate exercise/walking as tolerated.  Please consider getting Shingrix and Tdap vaccine at local pharmacy.

## 2023-06-12 NOTE — Telephone Encounter (Signed)
 Who is your primary care physician: Jeffery Munoz  Reasons for the colonoscopy: screening  Have you had a colonoscopy before?  no  Do you have family history of colon cancer? no  Previous colonoscopy with polyps removed? no  Do you have a history colorectal cancer?   no  Are you diabetic? If yes, Type 1 or Type 2?    no  Do you have a prosthetic or mechanical heart valve? no  Do you have a pacemaker/defibrillator?   no  Have you had endocarditis/atrial fibrillation? no  Have you had joint replacement within the last 12 months?  no  Do you tend to be constipated or have to use laxatives? no  Do you have any history of drugs or alchohol?    Do you use supplemental oxygen?  no  Have you had a stroke or heart attack within the last 6 months? no  Do you take weight loss medication?  no  Do you take any blood-thinning medications such as: (aspirin, warfarin, Plavix, Aggrenox)  yes  If yes we need the name, milligram, dosage and who is prescribing doctor aspirin 81 mg once a day Current Outpatient Medications on File Prior to Visit  Medication Sig Dispense Refill   albuterol (VENTOLIN HFA) 108 (90 Base) MCG/ACT inhaler Inhale 2 puffs into the lungs every 6 (six) hours as needed for wheezing or shortness of breath. 18 g 1   amLODipine (NORVASC) 5 MG tablet Take 1 tablet (5 mg total) by mouth daily. 30 tablet 3   aspirin EC 81 MG tablet Take 81 mg by mouth daily. Swallow whole.     atorvastatin (LIPITOR) 10 MG tablet Take 1 tablet (10 mg total) by mouth daily. 100 tablet 3   donepezil (ARICEPT) 5 MG tablet Take 1 tablet (5 mg total) by mouth at bedtime. 90 tablet 3   mirtazapine (REMERON) 7.5 MG tablet Take 1 tablet (7.5 mg total) by mouth at bedtime. 90 tablet 1   No current facility-administered medications on file prior to visit.    No Known Allergies   Pharmacy: [facility]  Primary Insurance Name: [facility]  Best number where you can be reached: [phone]

## 2023-06-12 NOTE — Progress Notes (Unsigned)
 New Patient Office Visit  Subjective:  Patient ID: Jeffery Munoz, male    DOB: January 11, 1956  Age: 68 y.o. MRN: [id number]  CC:  Chief Complaint  Patient presents with   Care Management    Needs forms completed to have his daughter be his caregiver    HPI ANGIE HOGG is a 68 y.o. male with past medical history of CAD, HTN, HLD and tobacco abuse who presents for f/u of his chronic medical conditions.  He has been having memory issues for the last few years.  His brother and brother's girlfriend have noticed these changes, but have been gradually progressing.  He is currently independent with ADLs, but is dependent for transportation and other IADLs.  No history of CVA reported. His MoCA was 16/30 in the last visit. ToxAssure showed cocaine X 2 (02/24, 09/24).  He admits to snorting cocaine at times, that his friends share with him.  After discussion, he agrees to stop using illicit drugs.  No episodes of delusions or hallucinations.  He has not been lost in the neighborhood yet, but his brother's girlfriend reports that he has wandering spells in the neighborhood.  He is tolerating Aricept well now.  He has insomnia as well, which has improved with mirtazapine.  He has started taking aspirin and statin for history of CAD status status post stent placement.  He has lost follow-up with cardiology.  HTN: His BP was WNL today.  He is taking amlodipine 5 mg QD now.  He currently denies any headache, dizziness, chest pain, dyspnea or palpitations.  He smokes on some days.  He reports smoking 0.5 pack/day for about 8-10 years.    Past Medical History:  Diagnosis Date   CHF (congestive heart failure) (HCC)    Childhood asthma    Coronary atherosclerosis of native coronary artery    Essential hypertension, benign    History of pneumonia    September 2015 - treated at [facility]    Hyperlipidemia    MI (myocardial infarction) (HCC)    8 years ago    Past Surgical History:  Procedure  Laterality Date   APPENDECTOMY      Family History  Problem Relation Age of Onset   Heart disease Mother        Age 30-70    Social History   Socioeconomic History   Marital status: Married    Spouse name: Not on file   Number of children: Not on file   Years of education: Not on file   Highest education level: Not on file  Occupational History   Not on file  Tobacco Use   Smoking status: Every Day    Current packs/day: 0.50    Average packs/day: 0.5 packs/day for 44.3 years (22.1 ttl pk-yrs)    Types: Cigarettes    Start date: 03/03/1979   Smokeless tobacco: Never  Vaping Use   Vaping status: Never Used  Substance and Sexual Activity   Alcohol use: Yes    Comment: Occasional   Drug use: No    Comment: Prior history of cocaine and marijuana (positive UDS 2004)   Sexual activity: Not on file  Other Topics Concern   Not on file  Social History Narrative   Not on file   Social Drivers of Health   Financial Resource Strain: Not on file  Food Insecurity: Not on file  Transportation Needs: Not on file  Physical Activity: Not on file  Stress: Not on file  Social Connections:  Not on file  Intimate Partner Violence: Not on file    ROS Review of Systems  Constitutional:  Negative for chills and fever.  HENT:  Negative for congestion and sore throat.   Eyes:  Negative for pain and discharge.  Respiratory:  Negative for cough and shortness of breath.   Cardiovascular:  Negative for chest pain and palpitations.  Gastrointestinal:  Negative for diarrhea, nausea and vomiting.  Endocrine: Negative for polydipsia and polyuria.  Genitourinary:  Negative for dysuria and hematuria.  Musculoskeletal:  Negative for neck pain and neck stiffness.  Skin:  Negative for rash.  Neurological:  Negative for dizziness, weakness, numbness and headaches.  Psychiatric/Behavioral:  Positive for confusion and sleep disturbance. Negative for agitation and behavioral problems.      Objective:   Today's Vitals: BP 138/78 (BP Location: Right Arm)   Pulse 70   Ht 6' (1.829 m)   Wt 178 lb (80.7 kg)   SpO2 93%   BMI 24.14 kg/m   Physical Exam Vitals reviewed.  Constitutional:      General: He is not in acute distress.    Appearance: He is not diaphoretic.  HENT:     Head: Normocephalic and atraumatic.     Nose: Nose normal.     Mouth/Throat:     Mouth: Mucous membranes are moist.  Eyes:     General: No scleral icterus.    Extraocular Movements: Extraocular movements intact.  Cardiovascular:     Rate and Rhythm: Normal rate and regular rhythm.     Heart sounds: Normal heart sounds. No murmur heard. Pulmonary:     Breath sounds: Normal breath sounds. No wheezing or rales.  Musculoskeletal:     Cervical back: Neck supple. No tenderness.     Right lower leg: No edema.     Left lower leg: No edema.  Skin:    General: Skin is warm.     Findings: No rash.  Neurological:     General: No focal deficit present.     Mental Status: He is alert.     Comments: A&O to person and place  Psychiatric:        Mood and Affect: Mood normal. Affect is flat.        Behavior: Behavior normal.     Assessment & Plan:   Problem List Items Addressed This Visit   None     Outpatient Encounter Medications as of 06/12/2023  Medication Sig   albuterol (VENTOLIN HFA) 108 (90 Base) MCG/ACT inhaler Inhale 2 puffs into the lungs every 6 (six) hours as needed for wheezing or shortness of breath.   amLODipine (NORVASC) 5 MG tablet Take 1 tablet (5 mg total) by mouth daily.   aspirin EC 81 MG tablet Take 81 mg by mouth daily. Swallow whole.   atorvastatin (LIPITOR) 10 MG tablet Take 1 tablet (10 mg total) by mouth daily.   donepezil (ARICEPT) 5 MG tablet Take 1 tablet (5 mg total) by mouth at bedtime.   mirtazapine (REMERON) 7.5 MG tablet Take 1 tablet (7.5 mg total) by mouth at bedtime.   No facility-administered encounter medications on file as of 06/12/2023.     Follow-up: No follow-ups on file.   Anabel Halon, MD

## 2023-06-13 NOTE — Telephone Encounter (Signed)
 Ok to schedule.  Room 1/2  Thanks,  Vista Lawman, MD Gastroenterology and Hepatology [facility] Gastroenterology

## 2023-06-14 NOTE — Assessment & Plan Note (Signed)
Smokes 2-3 cigarettes on some days  Asked about quitting: confirms that he/she currently smokes cigarettes Advise to quit smoking: Educated about QUITTING to reduce the risk of cancer, cardio and cerebrovascular disease. Assess willingness: Unwilling to quit at this time, but is working on cutting back. Assist with counseling and pharmacotherapy: Counseled and literature provided. Arrange for follow up: follow up in 3 months and continue to offer help.

## 2023-06-14 NOTE — Assessment & Plan Note (Addendum)
 Strictly advised to avoid illicit drug use Has history of CAD as well His memory deficit can also worsen due to substance abuse

## 2023-06-14 NOTE — Assessment & Plan Note (Signed)
 Flowsheet Row Office Visit from 06/12/2023 in [facility]  PHQ-9 Total Score 0      Well-controlled Likely component of dementia as well Had started Remeron for MDD and insomnia - now better

## 2023-06-14 NOTE — Assessment & Plan Note (Signed)
 Continue atorvastatin considering his history of CAD

## 2023-06-14 NOTE — Assessment & Plan Note (Signed)
 Gradual decline in memory MoCA: 16/30 Checked CBC, CMP, TSH, B12 Likely component of vascular dementia as well considering his history of CAD On donepezil 5 mg QD Checked MRI of the brain - has chronic microvascular changes Had started Remeron for insomnia and to improve appetite - has gained about 8 lbs since the last visit, family also reports improvement in sleep and behavior Needs to avoid substance abuse - cocaine in Urine drug screen in the past

## 2023-06-14 NOTE — Assessment & Plan Note (Signed)
 BP Readings from Last 1 Encounters:  06/12/23 138/78   Well-controlled with Amlodipine 5 mg QD Counseled for compliance with the medications Advised DASH diet and moderate exercise/walking, at least 150 mins/week

## 2023-06-25 MED ORDER — PEG 3350-KCL-NA BICARB-NACL 420 G PO SOLR: 4000.0000 mL | Freq: Once | ORAL | 0 refills | Status: AC

## 2023-06-25 NOTE — Telephone Encounter (Signed)
 Pt niece contacted and pt scheduled. Instructions will be mailed to pt. Prep sent to pharmacy. No PA needed per insurance.

## 2023-06-25 NOTE — Addendum Note (Signed)
 Addended by: Jordan Pardini on: 06/25/2023 12:15 PM   Modules accepted: Orders

## 2023-06-26 ENCOUNTER — Encounter (INDEPENDENT_AMBULATORY_CARE_PROVIDER_SITE_OTHER): Payer: Self-pay | Admitting: *Deleted

## 2023-06-26 NOTE — Telephone Encounter (Signed)
 Referral completed, [facility]CS apt letter sent to PCP

## 2023-07-22 ENCOUNTER — Telehealth (INDEPENDENT_AMBULATORY_CARE_PROVIDER_SITE_OTHER): Payer: Self-pay | Admitting: Gastroenterology

## 2023-07-22 ENCOUNTER — Encounter (HOSPITAL_COMMUNITY): Admission: RE | Payer: Self-pay | Source: Home / Self Care

## 2023-07-22 ENCOUNTER — Ambulatory Visit (HOSPITAL_COMMUNITY): Admission: RE | Admit: 2023-07-22 | Source: Home / Self Care | Admitting: Gastroenterology

## 2023-07-22 SURGERY — COLONOSCOPY
Anesthesia: Choice

## 2023-07-22 NOTE — Telephone Encounter (Signed)
 Pt daughter contacted but she will need to call back due to her being at work

## 2023-07-22 NOTE — Telephone Encounter (Signed)
 Per Magda Schneider daughter says she tried to cancel her father ,she could not get him to stop eating.  (TCS ASA 1-2)

## 2023-07-24 ENCOUNTER — Other Ambulatory Visit: Payer: Self-pay

## 2023-07-24 ENCOUNTER — Emergency Department (HOSPITAL_COMMUNITY)
Admission: EM | Admit: 2023-07-24 | Discharge: 2023-07-24 | Disposition: A | Discharge status: Home / Self Care | Attending: Emergency Medicine | Admitting: Emergency Medicine

## 2023-07-24 ENCOUNTER — Encounter (HOSPITAL_COMMUNITY): Payer: Self-pay

## 2023-07-24 DIAGNOSIS — M7989 Other specified soft tissue disorders: Secondary | ICD-10-CM | POA: Insufficient documentation

## 2023-07-24 DIAGNOSIS — R2232 Localized swelling, mass and lump, left upper limb: Secondary | ICD-10-CM | POA: Diagnosis not present

## 2023-07-24 DIAGNOSIS — Z7982 Long term (current) use of aspirin: Secondary | ICD-10-CM | POA: Insufficient documentation

## 2023-07-24 NOTE — ED Triage Notes (Signed)
 Pt arrived via POV c/o abscess to left forearm. Pt reports a couple weeks ago he noticed the abscess and thinks he may have been nit by an insect, but did not see the insect. Pt reports no pain. Swelling present below Patient's left elbow.

## 2023-07-24 NOTE — Discharge Instructions (Addendum)
 He does need to have this followed up in your doctor's office, there is no reason to cut this open, it would likely go away by itself but if it does become red swollen tender or you have fevers return immediately to the ER.  Or you can see your doctor in the office.  Either way please see your doctor in 1 week for recheck  Additionally please make sure you are taking your medications at home including your blood pressure medication as your blood pressure was elevated here tonight.

## 2023-07-24 NOTE — ED Provider Notes (Signed)
 [facility] EM[facility]PARTMENT AT [facility] Provider Note   CSN: [id number] Arrival date & time: 07/24/23  1411     History  Chief Complaint  Patient presents with   Abscess    Jeffery Munoz is a 68 y.o. male.   Abscess  Presents with several days of a swollen area on his left forearm, no fevers no chills no pain.    Home Medications Prior to Admission medications   Medication Sig Start Date End Date Taking? Authorizing Provider  albuterol  (VENTOLIN  HFA) 108 (90 Base) MCG/ACT inhaler Inhale 2 puffs into the lungs every 6 (six) hours as needed for wheezing or shortness of breath. 11/29/22   Meldon Sport, MD  amLODipine  (NORVASC ) 5 MG tablet Take 1 tablet (5 mg total) by mouth daily. 06/11/23   Meldon Sport, MD  aspirin EC 81 MG tablet Take 81 mg by mouth daily. Swallow whole.    [provider]  atorvastatin  (LIPITOR) 10 MG tablet Take 1 tablet (10 mg total) by mouth daily. 01/22/23   Meldon Sport, MD  donepezil  (ARICEPT ) 5 MG tablet Take 1 tablet (5 mg total) by mouth at bedtime. 06/12/23   Meldon Sport, MD  mirtazapine  (REMERON ) 7.5 MG tablet Take 1 tablet (7.5 mg total) by mouth at bedtime. 06/12/23   Meldon Sport, MD      Allergies    Patient has no known allergies.    Review of Systems   Review of Systems  All other systems reviewed and are negative.   Physical Exam Updated Vital Signs BP (!) 194/99 (BP Location: Right Arm)   Pulse (!) 55   Temp 98.5 F (36.9 C) (Temporal)   Resp 16   Ht 1.829 m (6')   Wt 80.7 kg   SpO2 100%   BMI 24.13 kg/m  Physical Exam Vitals and nursing note reviewed.  Constitutional:      Appearance: He is well-developed. He is not diaphoretic.  HENT:     Head: Normocephalic and atraumatic.  Eyes:     General:        Right eye: No discharge.        Left eye: No discharge.     Conjunctiva/sclera: Conjunctivae normal.  Pulmonary:     Effort: Pulmonary effort is normal. No respiratory distress.   Musculoskeletal:     Comments: No edema, supple elbow and wrist on the left upper extremity, there is a cyst that is approximately centimeters by 4 cm of the left forearm, it is soft, nonred, nontender, no warmth  Skin:    General: Skin is warm and dry.     Findings: No erythema or rash.  Neurological:     Mental Status: He is alert.     Coordination: Coordination normal.     ED Results / Procedures / Treatments   Labs (all labs ordered are listed, but only abnormal results are displayed) Labs Reviewed - No data to display  EKG None  Radiology No results found.  Procedures Procedures    Medications Ordered in ED Medications - No data to display  ED Course/ Medical Decision Making/ A&P                                 Medical Decision Making  Appears to be a simple cyst, there does not appear to be anything that looks infected, nontender, nonfebrile, well-appearing, states it is gradually  getting smaller.  Can follow-up in the outpatient setting.        Final Clinical Impression(s) / ED Diagnoses Final diagnoses:  Left arm swelling    Rx / DC Orders ED Discharge Orders     None         Early Glisson, MD 07/24/23 2029

## 2023-08-01 NOTE — Telephone Encounter (Signed)
 FYI

## 2023-08-01 NOTE — Telephone Encounter (Signed)
Noted will await call back

## 2023-08-01 NOTE — Telephone Encounter (Signed)
 noted

## 2023-10-18 ENCOUNTER — Other Ambulatory Visit: Payer: Self-pay | Admitting: Internal Medicine

## 2023-10-18 DIAGNOSIS — I25118 Atherosclerotic heart disease of native coronary artery with other forms of angina pectoris: Secondary | ICD-10-CM

## 2023-10-18 DIAGNOSIS — E782 Mixed hyperlipidemia: Secondary | ICD-10-CM

## 2023-11-12 ENCOUNTER — Ambulatory Visit: Admitting: Internal Medicine

## 2023-11-19 ENCOUNTER — Ambulatory Visit: Payer: Self-pay | Admitting: Internal Medicine

## 2023-11-19 ENCOUNTER — Ambulatory Visit: Payer: Self-pay

## 2023-12-09 ENCOUNTER — Other Ambulatory Visit: Payer: Self-pay | Admitting: Internal Medicine

## 2023-12-09 DIAGNOSIS — F330 Major depressive disorder, recurrent, mild: Secondary | ICD-10-CM

## 2023-12-27 ENCOUNTER — Encounter: Payer: Self-pay | Admitting: Internal Medicine

## 2023-12-27 ENCOUNTER — Ambulatory Visit (INDEPENDENT_AMBULATORY_CARE_PROVIDER_SITE_OTHER): Admitting: Internal Medicine

## 2023-12-27 VITALS — BP 160/84 | HR 62 | Ht 72.0 in | Wt 192.4 lb

## 2023-12-27 DIAGNOSIS — F02818 Dementia in other diseases classified elsewhere, unspecified severity, with other behavioral disturbance: Secondary | ICD-10-CM | POA: Diagnosis not present

## 2023-12-27 DIAGNOSIS — E782 Mixed hyperlipidemia: Secondary | ICD-10-CM

## 2023-12-27 DIAGNOSIS — I10 Essential (primary) hypertension: Secondary | ICD-10-CM

## 2023-12-27 DIAGNOSIS — G300 Alzheimer's disease with early onset: Secondary | ICD-10-CM

## 2023-12-27 DIAGNOSIS — R7303 Prediabetes: Secondary | ICD-10-CM

## 2023-12-27 DIAGNOSIS — F1410 Cocaine abuse, uncomplicated: Secondary | ICD-10-CM

## 2023-12-27 DIAGNOSIS — Z125 Encounter for screening for malignant neoplasm of prostate: Secondary | ICD-10-CM | POA: Diagnosis not present

## 2023-12-27 DIAGNOSIS — Z658 Other specified problems related to psychosocial circumstances: Secondary | ICD-10-CM | POA: Insufficient documentation

## 2023-12-27 MED ORDER — AMLODIPINE BESYLATE 10 MG PO TABS: 10.0000 mg | ORAL_TABLET | Freq: Every day | ORAL | 3 refills | Status: DC

## 2023-12-27 NOTE — Assessment & Plan Note (Signed)
 Strictly advised to avoid illicit drug use Has history of CAD as well His memory deficit can also worsen due to substance abuse

## 2023-12-27 NOTE — Assessment & Plan Note (Signed)
 Gradual decline in memory MoCA: 16/30 Checked CBC, CMP, TSH, B12 Likely component of vascular dementia as well considering his history of CAD On donepezil  5 mg QD Checked MRI of the brain - has chronic microvascular changes Had started Remeron  for insomnia and to improve appetite - has gained about 14 lbs since the last visit Needs to avoid substance abuse - cocaine in Urine drug screen in the past, will check urine tox assure today

## 2023-12-27 NOTE — Patient Instructions (Signed)
 Please start taking Amlodipine  10 mg once daily.  Please continue to take medications as prescribed.  Please continue to follow low salt diet and ambulate as tolerated.

## 2023-12-27 NOTE — Assessment & Plan Note (Addendum)
 BP Readings from Last 1 Encounters:  12/27/23 (!) 160/84   Uncontrolled with Amlodipine  5 mg QD, concern for compliance as well Considering his BP today, increased dose of amlodipine  to 10 mg QD Counseled for compliance with the medications Advised DASH diet and moderate exercise/walking, at least 150 mins/week

## 2023-12-27 NOTE — Assessment & Plan Note (Signed)
 Continue atorvastatin considering his history of CAD
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
## 2023-12-27 NOTE — Assessment & Plan Note (Signed)
 Ordered PSA after discussing its limitations for prostate cancer screening, including false positive results leading to additional investigations.

## 2023-12-28 LAB — HEMOGLOBIN A1C
Est. average glucose Bld gHb Est-mCnc: 120 mg/dL
Hgb A1c MFr Bld: 5.8 % — ABNORMAL HIGH (ref 4.8–5.6)

## 2023-12-28 LAB — CMP14+EGFR
ALT: 5 IU/L (ref 0–44)
AST: 16 IU/L (ref 0–40)
Albumin: 4.4 g/dL (ref 3.9–4.9)
Alkaline Phosphatase: 96 IU/L (ref 47–123)
BUN/Creatinine Ratio: 11 (ref 10–24)
BUN: 13 mg/dL (ref 8–27)
Bilirubin Total: 0.3 mg/dL (ref 0.0–1.2)
CO2: 25 mmol/L (ref 20–29)
Calcium: 9.4 mg/dL (ref 8.6–10.2)
Chloride: 104 mmol/L (ref 96–106)
Creatinine, Ser: 1.23 mg/dL (ref 0.76–1.27)
Globulin, Total: 2.5 g/dL (ref 1.5–4.5)
Glucose: 123 mg/dL — ABNORMAL HIGH (ref 70–99)
Potassium: 4.5 mmol/L (ref 3.5–5.2)
Sodium: 142 mmol/L (ref 134–144)
Total Protein: 6.9 g/dL (ref 6.0–8.5)
eGFR: 64 mL/min/1.73 (ref >59)

## 2023-12-28 LAB — LIPID PANEL
Chol/HDL Ratio: 2.9 ratio (ref 0.0–5.0)
Cholesterol, Total: 146 mg/dL (ref 100–199)
HDL: 51 mg/dL (ref >39)
LDL Chol Calc (NIH): 76 mg/dL (ref 0–99)
Triglycerides: 105 mg/dL (ref 0–149)
VLDL Cholesterol Cal: 19 mg/dL (ref 5–40)

## 2023-12-28 LAB — CBC WITH DIFFERENTIAL/PLATELET
Basophils Absolute: 0 x10E3/uL (ref 0.0–0.2)
Basos: 1 %
EOS (ABSOLUTE): 0.3 x10E3/uL (ref 0.0–0.4)
Eos: 6 %
Hematocrit: 43.6 % (ref 37.5–51.0)
Hemoglobin: 14.4 g/dL (ref 13.0–17.7)
Immature Grans (Abs): 0 x10E3/uL (ref 0.0–0.1)
Immature Granulocytes: 0 %
Lymphocytes Absolute: 2.5 x10E3/uL (ref 0.7–3.1)
Lymphs: 47 %
MCH: 30.7 pg (ref 26.6–33.0)
MCHC: 33 g/dL (ref 31.5–35.7)
MCV: 93 fL (ref 79–97)
Monocytes Absolute: 0.6 x10E3/uL (ref 0.1–0.9)
Monocytes: 12 %
Neutrophils Absolute: 1.8 x10E3/uL (ref 1.4–7.0)
Neutrophils: 34 %
Platelets: 288 x10E3/uL (ref 150–450)
RBC: 4.69 x10E6/uL (ref 4.14–5.80)
RDW: 13 % (ref 11.6–15.4)
WBC: 5.2 x10E3/uL (ref 3.4–10.8)

## 2023-12-28 LAB — TSH: TSH: 2.71 u[IU]/mL (ref 0.450–4.500)

## 2023-12-28 LAB — PSA: Prostate Specific Ag, Serum: 0.7 ng/mL (ref 0.0–4.0)

## 2023-12-30 ENCOUNTER — Telehealth: Payer: Self-pay

## 2023-12-30 ENCOUNTER — Ambulatory Visit: Payer: Self-pay | Admitting: Internal Medicine

## 2023-12-30 NOTE — Progress Notes (Signed)
 Complex Care Management Note Care Guide Note  12/30/2023 Name: Jeffery Munoz MRN: [id number] DOB: 10/05/1955   Complex Care Management Outreach Attempts: An unsuccessful telephone outreach was attempted today to offer the patient information about available complex care management services.  Follow Up Plan:  Additional outreach attempts will be made to offer the patient complex care management information and services.   Encounter Outcome:  No Answer  [name] , RMA     [name], [facility] Guide  Direct Dial: [phone]  Website: [email or web address]

## 2024-01-02 LAB — TOXASSURE SELECT 13 (MW), URINE

## 2024-01-08 ENCOUNTER — Ambulatory Visit: Payer: Self-pay

## 2024-01-08 NOTE — Progress Notes (Signed)
 Patient Daughter answered call and she is not on DPR. She advised me that patient does not have a phone and there is no way for me to speak with him.I advised her that they need to go to office to sign a new DPR.  [name] , RMA     [facility]  [facility], [facility] Guide  Direct Dial: [phone]  Website: [email or web address]

## 2024-02-18 ENCOUNTER — Telehealth: Payer: Self-pay

## 2024-02-18 NOTE — Telephone Encounter (Signed)
 No calls from clinical staff have been made to pt.

## 2024-02-18 NOTE — Telephone Encounter (Signed)
 Copied from CRM #[id number]. Topic: General - Call Back - No Documentation >> Feb 18, 2024  3:42 PM Hadassah PARAS wrote: Reason for CRM: Pt's daughter Linda calling returning missed call. Please advise on #[phone]

## 2024-02-26 ENCOUNTER — Encounter: Payer: Self-pay | Admitting: Internal Medicine

## 2024-02-26 ENCOUNTER — Ambulatory Visit (INDEPENDENT_AMBULATORY_CARE_PROVIDER_SITE_OTHER): Admitting: Internal Medicine

## 2024-02-26 VITALS — BP 134/78 | HR 62 | Ht 72.0 in | Wt 189.0 lb

## 2024-02-26 DIAGNOSIS — F411 Generalized anxiety disorder: Secondary | ICD-10-CM | POA: Diagnosis not present

## 2024-02-26 DIAGNOSIS — F02818 Dementia in other diseases classified elsewhere, unspecified severity, with other behavioral disturbance: Secondary | ICD-10-CM | POA: Diagnosis not present

## 2024-02-26 DIAGNOSIS — Z720 Tobacco use: Secondary | ICD-10-CM | POA: Diagnosis not present

## 2024-02-26 DIAGNOSIS — I10 Essential (primary) hypertension: Secondary | ICD-10-CM

## 2024-02-26 DIAGNOSIS — G300 Alzheimer's disease with early onset: Secondary | ICD-10-CM

## 2024-02-26 DIAGNOSIS — I25118 Atherosclerotic heart disease of native coronary artery with other forms of angina pectoris: Secondary | ICD-10-CM

## 2024-02-26 DIAGNOSIS — F330 Major depressive disorder, recurrent, mild: Secondary | ICD-10-CM | POA: Diagnosis not present

## 2024-02-26 DIAGNOSIS — F1020 Alcohol dependence, uncomplicated: Secondary | ICD-10-CM

## 2024-02-26 DIAGNOSIS — E782 Mixed hyperlipidemia: Secondary | ICD-10-CM

## 2024-02-26 DIAGNOSIS — B351 Tinea unguium: Secondary | ICD-10-CM | POA: Diagnosis not present

## 2024-02-26 DIAGNOSIS — Z0001 Encounter for general adult medical examination with abnormal findings: Secondary | ICD-10-CM | POA: Diagnosis not present

## 2024-02-26 DIAGNOSIS — J42 Unspecified chronic bronchitis: Secondary | ICD-10-CM

## 2024-02-26 DIAGNOSIS — F1410 Cocaine abuse, uncomplicated: Secondary | ICD-10-CM | POA: Diagnosis not present

## 2024-02-26 MED ORDER — ATORVASTATIN CALCIUM 10 MG PO TABS: 10.0000 mg | ORAL_TABLET | Freq: Every day | ORAL | 3 refills | Status: AC

## 2024-02-26 MED ORDER — MIRTAZAPINE 15 MG PO TABS: 15.0000 mg | ORAL_TABLET | Freq: Every day | ORAL | 1 refills | Status: AC

## 2024-02-26 MED ORDER — ALBUTEROL SULFATE HFA 108 (90 BASE) MCG/ACT IN AERS: 2.0000 | INHALATION_SPRAY | Freq: Four times a day (QID) | RESPIRATORY_TRACT | 1 refills | Status: AC | PRN

## 2024-02-26 MED ORDER — AMLODIPINE BESYLATE 10 MG PO TABS: 10.0000 mg | ORAL_TABLET | Freq: Every day | ORAL | 3 refills | Status: AC

## 2024-02-26 MED ORDER — HYDROXYZINE PAMOATE 25 MG PO CAPS: 25.0000 mg | ORAL_CAPSULE | Freq: Three times a day (TID) | ORAL | 2 refills | Status: AC | PRN

## 2024-02-26 MED ORDER — DONEPEZIL HCL 5 MG PO TABS: 5.0000 mg | ORAL_TABLET | Freq: Every day | ORAL | 3 refills | Status: AC

## 2024-02-26 NOTE — Assessment & Plan Note (Signed)
 Likely component of cocaine withdrawal in addition to dementia related GAD Hydroxyzine  as needed for anxiety Increased dose of Remeron  to 15 mg qHS

## 2024-02-26 NOTE — Assessment & Plan Note (Addendum)
 Chronic use - last urine tox assure showed cocaine in 10/25, but has quit since 11/25 Strictly advised to avoid illicit drug use Has history of CAD as well His memory deficit can also worsen due to substance abuse

## 2024-02-26 NOTE — Assessment & Plan Note (Signed)
 Gradual decline in memory MoCA: 16/30 Checked CBC, CMP, TSH, B12 Likely component of vascular dementia as well considering his history of CAD; substance abuse also worsening cognitive functioning On donepezil  5 mg QD Checked MRI of the brain - has chronic microvascular changes Had started Remeron  for insomnia and to improve appetite - increased dose to 15 mg as he still has insomnia Needs to avoid substance abuse - cocaine in Urine drug screen recently

## 2024-02-26 NOTE — Assessment & Plan Note (Signed)
 S/p stent placement On aspirin and statin Currently denies chest pain or dyspnea Needs to quit smoking

## 2024-02-26 NOTE — Patient Instructions (Addendum)
 Please schedule AWV.  Please start taking Mirtazepine 15 mg for insomnia instead of 7.5 mg dose.  Please take Hydroxyzine  as needed for anxiety.  Please call 1-800-QUIT-NOW to discuss about nicotine patch.  Please continue to take medications as prescribed.  Please continue to follow low salt diet and ambulate as tolerated.  Please consider getting Shingrix and Tdap vaccine at local pharmacy.  You are being referred to [facility] foot center. [phone]  [address] [place], [place] [address]

## 2024-02-26 NOTE — Assessment & Plan Note (Addendum)
 BP Readings from Last 1 Encounters:  02/26/24 134/78   Well-controlled with Amlodipine  10 mg QD now Counseled for compliance with the medications Advised DASH diet and moderate exercise/walking, at least 150 mins/week

## 2024-02-26 NOTE — Assessment & Plan Note (Addendum)
 Overall well-controlled Has cut down smoking Albuterol  as needed for dyspnea

## 2024-02-26 NOTE — Assessment & Plan Note (Signed)
 Continue atorvastatin considering his history of CAD

## 2024-02-26 NOTE — Assessment & Plan Note (Signed)
 Smokes 2-3 cigarettes on some days  Asked about quitting: confirms that he/she currently smokes cigarettes Advise to quit smoking: Educated about QUITTING to reduce the risk of cancer, cardio and cerebrovascular disease. Assess willingness: Unwilling to quit at this time, but is working on cutting back. Assist with counseling and pharmacotherapy: Counseled and literature provided.  Nicotine patch advised.  Can contact 1-800-QUIT-NOW. Arrange for follow up: follow up in 3 months and continue to offer help.

## 2024-02-26 NOTE — Progress Notes (Signed)
 "  Established Patient Office Visit  Subjective:  Patient ID: Jeffery Munoz, male    DOB: October 01, 1955  Age: 68 y.o. MRN: [id number]  CC:  Chief Complaint  Patient presents with   Hypertension    2 month f/u    Insomnia    Pt reports trouble sleeping.    HPI Jeffery Munoz is a 68 y.o. male with past medical history of CAD, HTN, HLD and tobacco abuse who presents for f/u of his chronic medical conditions.  His daughter is present during the visit today.  He has been having memory issues for the last few years.  Family members have noticed these changes, but have been gradually progressing.  He is currently independent with ADLs, but is dependent for transportation and other IADLs.  No history of CVA reported. His MoCA was 16/30 previously, but likely has impact of polysubstance abuse. ToxAssure showed cocaine.  He admits to snorting cocaine in the past at times, that his friends shared with him.  His daughter states that he has stopped using illicit drugs since the last visit as she has moved him in at her place. No episodes of delusions or hallucinations.  He has not been lost in the neighborhood yet, but his daughter reports that he used to have wandering spells in the neighborhood.  He is tolerating Aricept  well now.  He has insomnia despite taking mirtazapine .  He also has episodes of anxiety, leading to jitteriness.  He has started taking aspirin and statin for history of CAD status status post stent placement.  He has lost follow-up with cardiology.  HTN: His BP was WNL today.  He reports taking amlodipine  10 mg QD regularly.  He currently denies any headache, dizziness, chest pain, dyspnea or palpitations.  He smokes on some days.  He reports smoking 0.5 pack/day for about 8-10 years.  His daughter had brought form from social security for his decision-making capacity for financial activities in the last visit. Considering his progressive dementia, papers had been filled and  provided to the patient.  Past Medical History:  Diagnosis Date   CHF (congestive heart failure) (HCC)    Childhood asthma    Coronary atherosclerosis of native coronary artery    Essential hypertension, benign    History of pneumonia    September 2015 - [facility] at [facility]    Hyperlipidemia    MI (myocardial infarction) (HCC)    8 years ago    Past Surgical History:  Procedure Laterality Date   APPENDECTOMY      Family History  Problem Relation Age of Onset   Heart disease Mother        Age 55-70    Social History   Socioeconomic History   Marital status: Married    Spouse name: Not on file   Number of children: Not on file   Years of education: Not on file   Highest education level: Not on file  Occupational History   Not on file  Tobacco Use   Smoking status: Every Day    Current packs/day: 0.50    Average packs/day: 0.5 packs/day for 45.0 years (22.5 ttl pk-yrs)    Types: Cigarettes    Start date: 03/03/1979   Smokeless tobacco: Never  Vaping Use   Vaping status: Never Used  Substance and Sexual Activity   Alcohol use: Yes    Comment: Occasional   Drug use: No    Comment: Prior history of cocaine and marijuana (positive UDS 2004)  Sexual activity: Not on file  Other Topics Concern   Not on file  Social History Narrative   Not on file   Social Drivers of Health   Tobacco Use: High Risk (02/26/2024)   Patient History    Smoking Tobacco Use: Every Day    Smokeless Tobacco Use: Never    Passive Exposure: Not on file  Financial Resource Strain: Not on file  Food Insecurity: Not on file  Transportation Needs: Not on file  Physical Activity: Not on file  Stress: Not on file  Social Connections: Not on file  Intimate Partner Violence: Not on file  Depression (PHQ2-9): Low Risk (02/26/2024)   Depression (PHQ2-9)    PHQ-2 Score: 4  Recent Concern: Depression (PHQ2-9) - Medium Risk (12/27/2023)   Depression (PHQ2-9)    PHQ-2 Score: 6  Alcohol  Screen: Not on file  Housing: Not on file  Utilities: Not on file  Health Literacy: Not on file    ROS Review of Systems  Constitutional:  Negative for chills and fever.  HENT:  Negative for congestion and sore throat.   Eyes:  Negative for pain and discharge.  Respiratory:  Negative for cough and shortness of breath.   Cardiovascular:  Negative for chest pain and palpitations.  Gastrointestinal:  Negative for diarrhea, nausea and vomiting.  Endocrine: Negative for polydipsia and polyuria.  Genitourinary:  Negative for dysuria and hematuria.  Musculoskeletal:  Negative for neck pain and neck stiffness.  Skin:  Negative for rash.  Neurological:  Negative for dizziness, weakness, numbness and headaches.  Psychiatric/Behavioral:  Positive for confusion and sleep disturbance. Negative for agitation and behavioral problems.     Objective:   Today's Vitals: BP 134/78 (BP Location: Right Arm)   Pulse 62   Ht 6' (1.829 m)   Wt 189 lb (85.7 kg)   SpO2 96%   BMI 25.63 kg/m   Physical Exam Vitals reviewed.  Constitutional:      General: He is not in acute distress.    Appearance: He is not diaphoretic.  HENT:     Head: Normocephalic and atraumatic.     Nose: Nose normal.     Mouth/Throat:     Mouth: Mucous membranes are moist.  Eyes:     General: No scleral icterus.    Extraocular Movements: Extraocular movements intact.  Cardiovascular:     Rate and Rhythm: Normal rate and regular rhythm.     Heart sounds: Normal heart sounds. No murmur heard. Pulmonary:     Breath sounds: Normal breath sounds. No wheezing or rales.  Abdominal:     Palpations: Abdomen is soft.     Tenderness: There is no abdominal tenderness.  Musculoskeletal:     Cervical back: Neck supple. No tenderness.     Right lower leg: No edema.     Left lower leg: No edema.  Skin:    General: Skin is warm.     Findings: No rash.  Neurological:     General: No focal deficit present.     Mental Status: He is  alert.     Cranial Nerves: No cranial nerve deficit.     Sensory: No sensory deficit.     Motor: No weakness.     Comments: A&O to person and place  Psychiatric:        Mood and Affect: Mood is anxious. Affect is flat.        Behavior: Behavior normal.     Assessment & Plan:   Problem  List Items Addressed This Visit       Cardiovascular and Mediastinum   Coronary artery disease   S/p stent placement On aspirin and statin Currently denies chest pain or dyspnea Needs to quit smoking      Relevant Medications   amLODipine  (NORVASC ) 10 MG tablet   atorvastatin  (LIPITOR) 10 MG tablet   Essential hypertension   BP Readings from Last 1 Encounters:  02/26/24 134/78   Well-controlled with Amlodipine  10 mg QD now Counseled for compliance with the medications Advised DASH diet and moderate exercise/walking, at least 150 mins/week      Relevant Medications   amLODipine  (NORVASC ) 10 MG tablet   atorvastatin  (LIPITOR) 10 MG tablet     Respiratory   Chronic bronchitis (HCC)   Overall well-controlled Has cut down smoking Albuterol  as needed for dyspnea      Relevant Medications   albuterol  (VENTOLIN  HFA) 108 (90 Base) MCG/ACT inhaler     Nervous and Auditory   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)   Gradual decline in memory MoCA: 16/30 Checked CBC, CMP, TSH, B12 Likely component of vascular dementia as well considering his history of CAD; substance abuse also worsening cognitive functioning On donepezil  5 mg QD Checked MRI of the brain - has chronic microvascular changes Had started Remeron  for insomnia and to improve appetite - increased dose to 15 mg as he still has insomnia Needs to avoid substance abuse - cocaine in Urine drug screen recently      Relevant Medications   donepezil  (ARICEPT ) 5 MG tablet   mirtazapine  (REMERON ) 15 MG tablet   hydrOXYzine  (VISTARIL ) 25 MG capsule     Musculoskeletal and Integument   Onychomycosis   Has  thick and long toenails Referred to podiatry in [facility] for toenail trimming      Relevant Orders   Ambulatory referral to Podiatry     Other   Mixed hyperlipidemia (Chronic)   Continue atorvastatin  considering his history of CAD      Relevant Medications   amLODipine  (NORVASC ) 10 MG tablet   atorvastatin  (LIPITOR) 10 MG tablet   Mild episode of recurrent major depressive disorder   Flowsheet Row Office Visit from 02/26/2024 in [facility]  PHQ-9 Total Score 4   Well-controlled Likely component of dementia as well Increased dose of Remeron  to 15 mg qHS for MDD and insomnia      Relevant Medications   mirtazapine  (REMERON ) 15 MG tablet   hydrOXYzine  (VISTARIL ) 25 MG capsule   Cocaine abuse (HCC) - Primary   Chronic use - last urine tox assure showed cocaine in 10/25, but has quit since 11/25 Strictly advised to avoid illicit drug use Has history of CAD as well His memory deficit can also worsen due to substance abuse      GAD (generalized anxiety disorder)   Likely component of cocaine withdrawal in addition to dementia related GAD Hydroxyzine  as needed for anxiety Increased dose of Remeron  to 15 mg qHS      Relevant Medications   mirtazapine  (REMERON ) 15 MG tablet   hydrOXYzine  (VISTARIL ) 25 MG capsule       Outpatient Encounter Medications as of 02/26/2024  Medication Sig   aspirin EC 81 MG tablet Take 81 mg by mouth daily. Swallow whole.   hydrOXYzine  (VISTARIL ) 25 MG capsule Take 1 capsule (25 mg total) by mouth every 8 (eight) hours as needed.   [DISCONTINUED] albuterol  (VENTOLIN  HFA) 108 (90 Base) MCG/ACT inhaler Inhale 2  puffs into the lungs every 6 (six) hours as needed for wheezing or shortness of breath.   [DISCONTINUED] amLODipine  (NORVASC ) 10 MG tablet Take 1 tablet (10 mg total) by mouth daily.   [DISCONTINUED] atorvastatin  (LIPITOR) 10 MG tablet Take 1 tablet (10 mg total) by mouth daily.   [DISCONTINUED] donepezil  (ARICEPT ) 5  MG tablet Take 1 tablet (5 mg total) by mouth at bedtime.   [DISCONTINUED] mirtazapine  (REMERON ) 7.5 MG tablet TAKE ONE TABLET BY MOUTH AT BEDTIME   albuterol  (VENTOLIN  HFA) 108 (90 Base) MCG/ACT inhaler Inhale 2 puffs into the lungs every 6 (six) hours as needed for wheezing or shortness of breath.   amLODipine  (NORVASC ) 10 MG tablet Take 1 tablet (10 mg total) by mouth daily.   atorvastatin  (LIPITOR) 10 MG tablet Take 1 tablet (10 mg total) by mouth daily.   donepezil  (ARICEPT ) 5 MG tablet Take 1 tablet (5 mg total) by mouth at bedtime.   mirtazapine  (REMERON ) 15 MG tablet Take 1 tablet (15 mg total) by mouth at bedtime.   No facility-administered encounter medications on file as of 02/26/2024.    Follow-up: Return in about 4 months (around 06/26/2024).   Suzzane MARLA Blanch, MD "

## 2024-02-26 NOTE — Assessment & Plan Note (Signed)
Physical exam as documented. Fasting blood tests reviewed today. Advised to get Shingrix and Tdap vaccines at local pharmacy.

## 2024-02-26 NOTE — Assessment & Plan Note (Signed)
 Flowsheet Row Office Visit from 02/26/2024 in [facility]  PHQ-9 Total Score 4   Well-controlled Likely component of dementia as well Increased dose of Remeron  to 15 mg qHS for MDD and insomnia

## 2024-02-26 NOTE — Assessment & Plan Note (Signed)
 Has thick and long toenails Referred to podiatry in [place] for toenail trimming

## 2024-03-23 ENCOUNTER — Ambulatory Visit: Payer: Self-pay | Admitting: Internal Medicine

## 2024-04-27 ENCOUNTER — Ambulatory Visit: Payer: Self-pay

## 2024-06-30 ENCOUNTER — Ambulatory Visit: Payer: Self-pay | Admitting: Internal Medicine
